# Patient Record
Sex: Female | Born: 1950 | Race: Black or African American | Hispanic: No | Smoking: Never smoker
Health system: Southern US, Community
[De-identification: ages and names within clinical notes are randomized; demographics above are authoritative.]

## PROBLEM LIST (undated history)

## (undated) DIAGNOSIS — M1990 Unspecified osteoarthritis, unspecified site: Secondary | ICD-10-CM

## (undated) DIAGNOSIS — I10 Essential (primary) hypertension: Secondary | ICD-10-CM

## (undated) DIAGNOSIS — E785 Hyperlipidemia, unspecified: Secondary | ICD-10-CM

## (undated) HISTORY — DX: Essential (primary) hypertension: I10

## (undated) HISTORY — DX: Hyperlipidemia, unspecified: E78.5

## (undated) HISTORY — DX: Unspecified osteoarthritis, unspecified site: M19.90

---

## 1998-03-31 ENCOUNTER — Encounter: Admission: RE | Admit: 1998-03-31 | Discharge: 1998-06-29 | Payer: Self-pay | Admitting: Family Medicine

## 1998-03-31 ENCOUNTER — Encounter: Admission: RE | Admit: 1998-03-31 | Discharge: 1998-06-29 | Payer: Self-pay | Admitting: *Deleted

## 1998-04-22 ENCOUNTER — Encounter: Admission: RE | Admit: 1998-04-22 | Discharge: 1998-04-22 | Payer: Self-pay | Admitting: Family Medicine

## 1998-08-30 ENCOUNTER — Encounter: Admission: RE | Admit: 1998-08-30 | Discharge: 1998-08-30 | Payer: Self-pay | Admitting: Sports Medicine

## 1998-08-31 ENCOUNTER — Encounter: Admission: RE | Admit: 1998-08-31 | Discharge: 1998-08-31 | Payer: Self-pay | Admitting: Family Medicine

## 1998-09-27 ENCOUNTER — Encounter: Admission: RE | Admit: 1998-09-27 | Discharge: 1998-09-27 | Payer: Self-pay | Admitting: Family Medicine

## 1998-10-27 ENCOUNTER — Encounter: Admission: RE | Admit: 1998-10-27 | Discharge: 1998-10-27 | Payer: Self-pay | Admitting: Family Medicine

## 1998-11-04 ENCOUNTER — Encounter: Admission: RE | Admit: 1998-11-04 | Discharge: 1998-11-04 | Payer: Self-pay | Admitting: Family Medicine

## 1998-12-28 ENCOUNTER — Encounter: Admission: RE | Admit: 1998-12-28 | Discharge: 1998-12-28 | Payer: Self-pay | Admitting: Family Medicine

## 1999-07-03 ENCOUNTER — Encounter: Admission: RE | Admit: 1999-07-03 | Discharge: 1999-07-03 | Payer: Self-pay | Admitting: Family Medicine

## 1999-07-07 ENCOUNTER — Encounter: Admission: RE | Admit: 1999-07-07 | Discharge: 1999-07-07 | Payer: Self-pay | Admitting: Family Medicine

## 1999-08-23 ENCOUNTER — Encounter: Admission: RE | Admit: 1999-08-23 | Discharge: 1999-08-23 | Payer: Self-pay | Admitting: Family Medicine

## 1999-10-13 ENCOUNTER — Encounter: Admission: RE | Admit: 1999-10-13 | Discharge: 1999-10-13 | Payer: Self-pay | Admitting: Family Medicine

## 1999-11-10 ENCOUNTER — Encounter: Admission: RE | Admit: 1999-11-10 | Discharge: 1999-11-10 | Payer: Self-pay | Admitting: Family Medicine

## 1999-12-29 ENCOUNTER — Encounter: Admission: RE | Admit: 1999-12-29 | Discharge: 1999-12-29 | Payer: Self-pay | Admitting: Family Medicine

## 2000-01-10 ENCOUNTER — Other Ambulatory Visit: Admission: RE | Admit: 2000-01-10 | Discharge: 2000-01-10 | Payer: Self-pay | Admitting: Obstetrics & Gynecology

## 2000-01-10 ENCOUNTER — Encounter: Admission: RE | Admit: 2000-01-10 | Discharge: 2000-01-10 | Payer: Self-pay | Admitting: Family Medicine

## 2000-04-02 ENCOUNTER — Encounter: Admission: RE | Admit: 2000-04-02 | Discharge: 2000-04-02 | Payer: Self-pay | Admitting: Sports Medicine

## 2000-07-04 ENCOUNTER — Encounter: Admission: RE | Admit: 2000-07-04 | Discharge: 2000-07-04 | Payer: Self-pay | Admitting: Family Medicine

## 2000-11-08 ENCOUNTER — Encounter: Admission: RE | Admit: 2000-11-08 | Discharge: 2000-11-08 | Payer: Self-pay | Admitting: Family Medicine

## 2001-03-21 ENCOUNTER — Encounter: Admission: RE | Admit: 2001-03-21 | Discharge: 2001-03-21 | Payer: Self-pay | Admitting: Family Medicine

## 2001-03-24 ENCOUNTER — Emergency Department (HOSPITAL_COMMUNITY): Admission: EM | Admit: 2001-03-24 | Discharge: 2001-03-24 | Payer: Self-pay | Admitting: Emergency Medicine

## 2001-03-24 ENCOUNTER — Encounter: Payer: Self-pay | Admitting: Emergency Medicine

## 2001-04-04 ENCOUNTER — Encounter: Admission: RE | Admit: 2001-04-04 | Discharge: 2001-04-04 | Payer: Self-pay | Admitting: Family Medicine

## 2001-05-01 ENCOUNTER — Encounter: Admission: RE | Admit: 2001-05-01 | Discharge: 2001-05-01 | Payer: Self-pay | Admitting: Family Medicine

## 2001-05-01 ENCOUNTER — Other Ambulatory Visit: Admission: RE | Admit: 2001-05-01 | Discharge: 2001-05-01 | Payer: Self-pay | Admitting: Family Medicine

## 2001-06-24 ENCOUNTER — Encounter: Admission: RE | Admit: 2001-06-24 | Discharge: 2001-06-24 | Payer: Self-pay | Admitting: Family Medicine

## 2001-08-22 ENCOUNTER — Encounter: Admission: RE | Admit: 2001-08-22 | Discharge: 2001-08-22 | Payer: Self-pay | Admitting: Family Medicine

## 2001-10-15 ENCOUNTER — Encounter: Admission: RE | Admit: 2001-10-15 | Discharge: 2001-10-15 | Payer: Self-pay | Admitting: Family Medicine

## 2001-10-28 ENCOUNTER — Encounter: Admission: RE | Admit: 2001-10-28 | Discharge: 2001-10-28 | Payer: Self-pay | Admitting: Family Medicine

## 2001-12-12 ENCOUNTER — Encounter: Admission: RE | Admit: 2001-12-12 | Discharge: 2001-12-12 | Payer: Self-pay | Admitting: Family Medicine

## 2002-04-01 ENCOUNTER — Encounter: Admission: RE | Admit: 2002-04-01 | Discharge: 2002-04-01 | Payer: Self-pay | Admitting: Family Medicine

## 2002-05-07 ENCOUNTER — Other Ambulatory Visit: Admission: RE | Admit: 2002-05-07 | Discharge: 2002-05-07 | Payer: Self-pay | Admitting: Family Medicine

## 2002-05-07 ENCOUNTER — Encounter: Admission: RE | Admit: 2002-05-07 | Discharge: 2002-05-07 | Payer: Self-pay | Admitting: Family Medicine

## 2002-10-13 ENCOUNTER — Encounter: Admission: RE | Admit: 2002-10-13 | Discharge: 2002-10-13 | Payer: Self-pay | Admitting: Family Medicine

## 2002-11-02 ENCOUNTER — Encounter: Admission: RE | Admit: 2002-11-02 | Discharge: 2002-11-02 | Payer: Self-pay | Admitting: Family Medicine

## 2003-04-26 ENCOUNTER — Encounter: Admission: RE | Admit: 2003-04-26 | Discharge: 2003-04-26 | Payer: Self-pay | Admitting: Family Medicine

## 2003-04-28 ENCOUNTER — Encounter: Admission: RE | Admit: 2003-04-28 | Discharge: 2003-04-28 | Payer: Self-pay | Admitting: Sports Medicine

## 2003-04-28 ENCOUNTER — Encounter: Payer: Self-pay | Admitting: Sports Medicine

## 2003-05-28 ENCOUNTER — Other Ambulatory Visit: Admission: RE | Admit: 2003-05-28 | Discharge: 2003-05-28 | Payer: Self-pay | Admitting: Family Medicine

## 2003-05-28 ENCOUNTER — Encounter: Admission: RE | Admit: 2003-05-28 | Discharge: 2003-05-28 | Payer: Self-pay | Admitting: Family Medicine

## 2003-07-01 ENCOUNTER — Encounter: Admission: RE | Admit: 2003-07-01 | Discharge: 2003-07-01 | Payer: Self-pay | Admitting: Sports Medicine

## 2003-10-14 ENCOUNTER — Encounter: Admission: RE | Admit: 2003-10-14 | Discharge: 2003-10-14 | Payer: Self-pay | Admitting: Family Medicine

## 2003-10-27 ENCOUNTER — Encounter: Admission: RE | Admit: 2003-10-27 | Discharge: 2003-10-27 | Payer: Self-pay | Admitting: Sports Medicine

## 2003-10-29 ENCOUNTER — Emergency Department (HOSPITAL_COMMUNITY): Admission: AD | Admit: 2003-10-29 | Discharge: 2003-10-29 | Payer: Self-pay | Admitting: Family Medicine

## 2003-11-03 ENCOUNTER — Emergency Department (HOSPITAL_COMMUNITY): Admission: AD | Admit: 2003-11-03 | Discharge: 2003-11-03 | Payer: Self-pay | Admitting: Family Medicine

## 2003-11-05 ENCOUNTER — Encounter: Admission: RE | Admit: 2003-11-05 | Discharge: 2003-11-05 | Payer: Self-pay | Admitting: Sports Medicine

## 2004-04-10 ENCOUNTER — Encounter: Admission: RE | Admit: 2004-04-10 | Discharge: 2004-04-10 | Payer: Self-pay | Admitting: Family Medicine

## 2004-06-23 ENCOUNTER — Encounter: Admission: RE | Admit: 2004-06-23 | Discharge: 2004-06-23 | Payer: Self-pay | Admitting: Sports Medicine

## 2004-09-30 ENCOUNTER — Encounter (INDEPENDENT_AMBULATORY_CARE_PROVIDER_SITE_OTHER): Payer: Self-pay | Admitting: *Deleted

## 2004-09-30 LAB — CONVERTED CEMR LAB

## 2004-10-04 ENCOUNTER — Ambulatory Visit: Payer: Self-pay | Admitting: Family Medicine

## 2004-10-11 ENCOUNTER — Ambulatory Visit: Payer: Self-pay | Admitting: Family Medicine

## 2004-10-11 ENCOUNTER — Other Ambulatory Visit: Admission: RE | Admit: 2004-10-11 | Discharge: 2004-10-11 | Payer: Self-pay | Admitting: Family Medicine

## 2004-12-04 ENCOUNTER — Ambulatory Visit: Payer: Self-pay | Admitting: Family Medicine

## 2005-02-12 ENCOUNTER — Ambulatory Visit: Payer: Self-pay | Admitting: Sports Medicine

## 2005-06-13 ENCOUNTER — Ambulatory Visit: Payer: Self-pay | Admitting: Family Medicine

## 2005-09-20 ENCOUNTER — Ambulatory Visit: Payer: Self-pay | Admitting: Family Medicine

## 2005-10-08 ENCOUNTER — Ambulatory Visit: Payer: Self-pay | Admitting: Family Medicine

## 2006-03-04 ENCOUNTER — Ambulatory Visit: Payer: Self-pay | Admitting: Family Medicine

## 2006-03-07 ENCOUNTER — Emergency Department (HOSPITAL_COMMUNITY): Admission: EM | Admit: 2006-03-07 | Discharge: 2006-03-07 | Payer: Self-pay | Admitting: Family Medicine

## 2006-10-09 ENCOUNTER — Ambulatory Visit: Payer: Self-pay | Admitting: Sports Medicine

## 2007-02-27 DIAGNOSIS — M1990 Unspecified osteoarthritis, unspecified site: Secondary | ICD-10-CM | POA: Insufficient documentation

## 2007-02-27 DIAGNOSIS — M479 Spondylosis, unspecified: Secondary | ICD-10-CM | POA: Insufficient documentation

## 2007-02-27 DIAGNOSIS — I10 Essential (primary) hypertension: Secondary | ICD-10-CM | POA: Insufficient documentation

## 2007-02-27 DIAGNOSIS — F411 Generalized anxiety disorder: Secondary | ICD-10-CM | POA: Insufficient documentation

## 2007-02-27 DIAGNOSIS — M545 Low back pain: Secondary | ICD-10-CM | POA: Insufficient documentation

## 2007-02-27 DIAGNOSIS — E669 Obesity, unspecified: Secondary | ICD-10-CM | POA: Insufficient documentation

## 2007-02-28 ENCOUNTER — Encounter (INDEPENDENT_AMBULATORY_CARE_PROVIDER_SITE_OTHER): Payer: Self-pay | Admitting: *Deleted

## 2007-08-28 ENCOUNTER — Ambulatory Visit: Payer: Self-pay | Admitting: Family Medicine

## 2007-09-11 ENCOUNTER — Telehealth (INDEPENDENT_AMBULATORY_CARE_PROVIDER_SITE_OTHER): Payer: Self-pay | Admitting: Family Medicine

## 2007-09-13 ENCOUNTER — Telehealth (INDEPENDENT_AMBULATORY_CARE_PROVIDER_SITE_OTHER): Payer: Self-pay | Admitting: Family Medicine

## 2007-09-18 ENCOUNTER — Encounter (INDEPENDENT_AMBULATORY_CARE_PROVIDER_SITE_OTHER): Payer: Self-pay | Admitting: Family Medicine

## 2007-09-29 ENCOUNTER — Ambulatory Visit: Payer: Self-pay | Admitting: Family Medicine

## 2007-09-30 ENCOUNTER — Encounter (INDEPENDENT_AMBULATORY_CARE_PROVIDER_SITE_OTHER): Payer: Self-pay | Admitting: Family Medicine

## 2007-10-01 ENCOUNTER — Telehealth (INDEPENDENT_AMBULATORY_CARE_PROVIDER_SITE_OTHER): Payer: Self-pay | Admitting: Family Medicine

## 2007-10-27 ENCOUNTER — Ambulatory Visit: Payer: Self-pay | Admitting: Sports Medicine

## 2007-10-27 ENCOUNTER — Other Ambulatory Visit: Admission: RE | Admit: 2007-10-27 | Discharge: 2007-10-27 | Payer: Self-pay | Admitting: Family Medicine

## 2007-10-27 ENCOUNTER — Encounter (INDEPENDENT_AMBULATORY_CARE_PROVIDER_SITE_OTHER): Payer: Self-pay | Admitting: Family Medicine

## 2007-10-28 LAB — CONVERTED CEMR LAB
Pap Smear: NORMAL
Pap Smear: NORMAL

## 2007-10-29 ENCOUNTER — Telehealth (INDEPENDENT_AMBULATORY_CARE_PROVIDER_SITE_OTHER): Payer: Self-pay | Admitting: Family Medicine

## 2007-10-29 ENCOUNTER — Encounter (INDEPENDENT_AMBULATORY_CARE_PROVIDER_SITE_OTHER): Payer: Self-pay | Admitting: Family Medicine

## 2007-11-13 ENCOUNTER — Telehealth: Payer: Self-pay | Admitting: Family Medicine

## 2007-12-01 ENCOUNTER — Encounter: Payer: Self-pay | Admitting: *Deleted

## 2008-02-10 ENCOUNTER — Telehealth: Payer: Self-pay | Admitting: Family Medicine

## 2008-04-08 ENCOUNTER — Ambulatory Visit: Payer: Self-pay | Admitting: Family Medicine

## 2008-04-08 LAB — CONVERTED CEMR LAB
Blood in Urine, dipstick: NEGATIVE
Glucose, Urine, Semiquant: NEGATIVE
Nitrite: NEGATIVE
Specific Gravity, Urine: 1.02
WBC Urine, dipstick: NEGATIVE
pH: 6.5

## 2008-05-03 ENCOUNTER — Encounter: Payer: Self-pay | Admitting: *Deleted

## 2008-05-04 ENCOUNTER — Encounter: Payer: Self-pay | Admitting: Family Medicine

## 2008-05-13 ENCOUNTER — Encounter: Admission: RE | Admit: 2008-05-13 | Discharge: 2008-05-13 | Payer: Self-pay | Admitting: Sports Medicine

## 2008-05-18 ENCOUNTER — Encounter: Payer: Self-pay | Admitting: Family Medicine

## 2008-05-25 ENCOUNTER — Telehealth: Payer: Self-pay | Admitting: *Deleted

## 2008-05-27 ENCOUNTER — Telehealth: Payer: Self-pay | Admitting: Family Medicine

## 2008-06-14 ENCOUNTER — Telehealth: Payer: Self-pay | Admitting: *Deleted

## 2008-06-14 ENCOUNTER — Ambulatory Visit: Payer: Self-pay | Admitting: Family Medicine

## 2008-06-15 ENCOUNTER — Encounter: Admission: RE | Admit: 2008-06-15 | Discharge: 2008-07-05 | Payer: Self-pay | Admitting: Sports Medicine

## 2008-06-22 ENCOUNTER — Telehealth: Payer: Self-pay | Admitting: *Deleted

## 2008-06-29 ENCOUNTER — Encounter: Payer: Self-pay | Admitting: Family Medicine

## 2008-07-02 ENCOUNTER — Encounter: Payer: Self-pay | Admitting: Family Medicine

## 2008-07-12 ENCOUNTER — Encounter: Payer: Self-pay | Admitting: Family Medicine

## 2008-07-12 ENCOUNTER — Ambulatory Visit: Payer: Self-pay | Admitting: Sports Medicine

## 2008-07-12 LAB — CONVERTED CEMR LAB
ALT: 16 units/L (ref 0–35)
AST: 19 units/L (ref 0–37)
CO2: 26 meq/L (ref 19–32)
Chloride: 102 meq/L (ref 96–112)
Creatinine, Ser: 1.01 mg/dL (ref 0.40–1.20)
LDL Cholesterol: 123 mg/dL — ABNORMAL HIGH (ref 0–99)
Sodium: 139 meq/L (ref 135–145)
Total Bilirubin: 0.3 mg/dL (ref 0.3–1.2)
Total CHOL/HDL Ratio: 4.9
Total Protein: 7.3 g/dL (ref 6.0–8.3)
VLDL: 25 mg/dL (ref 0–40)

## 2008-07-19 ENCOUNTER — Encounter: Payer: Self-pay | Admitting: *Deleted

## 2008-07-19 ENCOUNTER — Telehealth: Payer: Self-pay | Admitting: *Deleted

## 2008-08-05 ENCOUNTER — Telehealth: Payer: Self-pay | Admitting: *Deleted

## 2008-08-18 ENCOUNTER — Ambulatory Visit: Payer: Self-pay | Admitting: Family Medicine

## 2008-09-03 ENCOUNTER — Ambulatory Visit: Payer: Self-pay | Admitting: Family Medicine

## 2008-09-08 ENCOUNTER — Encounter: Payer: Self-pay | Admitting: Family Medicine

## 2008-09-13 ENCOUNTER — Telehealth: Payer: Self-pay | Admitting: *Deleted

## 2008-09-15 ENCOUNTER — Encounter: Payer: Self-pay | Admitting: *Deleted

## 2008-09-20 ENCOUNTER — Ambulatory Visit: Payer: Self-pay | Admitting: Family Medicine

## 2008-09-29 ENCOUNTER — Telehealth: Payer: Self-pay | Admitting: Family Medicine

## 2008-09-30 ENCOUNTER — Telehealth: Payer: Self-pay | Admitting: Family Medicine

## 2008-10-14 ENCOUNTER — Ambulatory Visit: Payer: Self-pay

## 2008-10-14 ENCOUNTER — Encounter (INDEPENDENT_AMBULATORY_CARE_PROVIDER_SITE_OTHER): Payer: Self-pay | Admitting: *Deleted

## 2008-10-23 ENCOUNTER — Telehealth (INDEPENDENT_AMBULATORY_CARE_PROVIDER_SITE_OTHER): Payer: Self-pay | Admitting: Family Medicine

## 2008-10-25 ENCOUNTER — Telehealth: Payer: Self-pay | Admitting: *Deleted

## 2008-10-25 ENCOUNTER — Ambulatory Visit: Payer: Self-pay | Admitting: Family Medicine

## 2008-10-29 ENCOUNTER — Telehealth: Payer: Self-pay | Admitting: *Deleted

## 2008-12-03 ENCOUNTER — Ambulatory Visit: Payer: Self-pay | Admitting: Family Medicine

## 2008-12-06 ENCOUNTER — Telehealth: Payer: Self-pay | Admitting: *Deleted

## 2009-06-01 ENCOUNTER — Encounter: Payer: Self-pay | Admitting: Family Medicine

## 2009-06-01 DIAGNOSIS — M4800 Spinal stenosis, site unspecified: Secondary | ICD-10-CM

## 2009-06-15 ENCOUNTER — Ambulatory Visit: Payer: Self-pay | Admitting: Family Medicine

## 2009-08-31 ENCOUNTER — Ambulatory Visit: Payer: Self-pay | Admitting: Family Medicine

## 2009-08-31 ENCOUNTER — Telehealth: Payer: Self-pay | Admitting: Family Medicine

## 2009-09-01 ENCOUNTER — Ambulatory Visit (HOSPITAL_COMMUNITY): Admission: RE | Admit: 2009-09-01 | Discharge: 2009-09-01 | Payer: Self-pay | Admitting: Family Medicine

## 2009-09-01 ENCOUNTER — Telehealth: Payer: Self-pay | Admitting: Family Medicine

## 2009-09-09 ENCOUNTER — Ambulatory Visit: Payer: Self-pay | Admitting: Family Medicine

## 2009-11-01 ENCOUNTER — Telehealth: Payer: Self-pay | Admitting: *Deleted

## 2009-11-09 ENCOUNTER — Ambulatory Visit: Payer: Self-pay | Admitting: Family Medicine

## 2010-03-21 ENCOUNTER — Telehealth: Payer: Self-pay | Admitting: Family Medicine

## 2010-03-22 ENCOUNTER — Ambulatory Visit: Payer: Self-pay | Admitting: Family Medicine

## 2010-03-22 ENCOUNTER — Encounter: Payer: Self-pay | Admitting: Family Medicine

## 2010-03-23 LAB — CONVERTED CEMR LAB
Albumin: 4 g/dL (ref 3.5–5.2)
Alkaline Phosphatase: 65 units/L (ref 39–117)
BUN: 15 mg/dL (ref 6–23)
CO2: 27 meq/L (ref 19–32)
Calcium: 9.6 mg/dL (ref 8.4–10.5)
Chloride: 101 meq/L (ref 96–112)
Direct LDL: 139 mg/dL — ABNORMAL HIGH
Glucose, Bld: 100 mg/dL — ABNORMAL HIGH (ref 70–99)
Hemoglobin: 10.9 g/dL — ABNORMAL LOW (ref 12.0–15.0)
Potassium: 4 meq/L (ref 3.5–5.3)
RBC: 3.53 M/uL — ABNORMAL LOW (ref 3.87–5.11)
Sodium: 138 meq/L (ref 135–145)
Total Protein: 7.8 g/dL (ref 6.0–8.3)
WBC: 7.9 10*3/uL (ref 4.0–10.5)

## 2010-03-31 ENCOUNTER — Encounter: Payer: Self-pay | Admitting: Family Medicine

## 2010-03-31 ENCOUNTER — Ambulatory Visit: Payer: Self-pay | Admitting: Family Medicine

## 2010-03-31 DIAGNOSIS — E785 Hyperlipidemia, unspecified: Secondary | ICD-10-CM

## 2010-03-31 DIAGNOSIS — D649 Anemia, unspecified: Secondary | ICD-10-CM

## 2010-03-31 DIAGNOSIS — E739 Lactose intolerance, unspecified: Secondary | ICD-10-CM | POA: Insufficient documentation

## 2010-04-05 LAB — CONVERTED CEMR LAB
Iron: 42 ug/dL (ref 42–145)
Saturation Ratios: 14 % — ABNORMAL LOW (ref 20–55)
TIBC: 290 ug/dL (ref 250–470)
UIBC: 248 ug/dL
Vitamin B-12: 633 pg/mL (ref 211–911)

## 2010-05-01 ENCOUNTER — Telehealth: Payer: Self-pay | Admitting: Family Medicine

## 2010-06-13 ENCOUNTER — Encounter: Payer: Self-pay | Admitting: Family Medicine

## 2010-08-01 ENCOUNTER — Emergency Department (HOSPITAL_COMMUNITY): Admission: EM | Admit: 2010-08-01 | Discharge: 2010-08-01 | Payer: Self-pay | Admitting: Emergency Medicine

## 2010-08-01 ENCOUNTER — Telehealth: Payer: Self-pay | Admitting: Family Medicine

## 2010-08-01 ENCOUNTER — Emergency Department (HOSPITAL_COMMUNITY): Admission: EM | Admit: 2010-08-01 | Discharge: 2010-08-01 | Payer: Self-pay | Admitting: Family Medicine

## 2010-08-02 ENCOUNTER — Telehealth (INDEPENDENT_AMBULATORY_CARE_PROVIDER_SITE_OTHER): Payer: Self-pay | Admitting: *Deleted

## 2010-08-02 ENCOUNTER — Telehealth: Payer: Self-pay | Admitting: Family Medicine

## 2010-08-03 ENCOUNTER — Telehealth: Payer: Self-pay | Admitting: Family Medicine

## 2010-08-03 ENCOUNTER — Telehealth (INDEPENDENT_AMBULATORY_CARE_PROVIDER_SITE_OTHER): Payer: Self-pay | Admitting: *Deleted

## 2010-08-04 ENCOUNTER — Encounter: Payer: Self-pay | Admitting: Family Medicine

## 2010-08-04 ENCOUNTER — Ambulatory Visit: Payer: Self-pay | Admitting: Family Medicine

## 2010-08-09 ENCOUNTER — Telehealth: Payer: Self-pay | Admitting: Family Medicine

## 2010-08-10 ENCOUNTER — Encounter: Payer: Self-pay | Admitting: Family Medicine

## 2010-08-10 ENCOUNTER — Ambulatory Visit (HOSPITAL_COMMUNITY): Admission: RE | Admit: 2010-08-10 | Discharge: 2010-08-10 | Payer: Self-pay | Admitting: Family Medicine

## 2010-08-10 ENCOUNTER — Ambulatory Visit: Payer: Self-pay | Admitting: Surgery

## 2010-08-11 LAB — CONVERTED CEMR LAB
BUN: 15 mg/dL (ref 6–23)
CO2: 27 meq/L (ref 19–32)
Calcium: 9 mg/dL (ref 8.4–10.5)
Creatinine, Ser: 0.98 mg/dL (ref 0.40–1.20)
Direct LDL: 136 mg/dL — ABNORMAL HIGH
Glucose, Bld: 109 mg/dL — ABNORMAL HIGH (ref 70–99)
TSH: 1.719 microintl units/mL (ref 0.350–4.500)
Total Bilirubin: 0.4 mg/dL (ref 0.3–1.2)

## 2010-08-30 ENCOUNTER — Ambulatory Visit: Payer: Self-pay | Admitting: Family Medicine

## 2010-08-30 LAB — CONVERTED CEMR LAB
HDL goal, serum: 40 mg/dL
LDL Goal: 130 mg/dL

## 2010-09-25 ENCOUNTER — Telehealth: Payer: Self-pay | Admitting: *Deleted

## 2010-09-26 ENCOUNTER — Ambulatory Visit: Payer: Self-pay | Admitting: Family Medicine

## 2010-11-04 ENCOUNTER — Telehealth: Payer: Self-pay | Admitting: Family Medicine

## 2010-11-22 ENCOUNTER — Telehealth: Payer: Self-pay | Admitting: *Deleted

## 2010-11-27 ENCOUNTER — Telehealth (INDEPENDENT_AMBULATORY_CARE_PROVIDER_SITE_OTHER): Payer: Self-pay | Admitting: *Deleted

## 2010-11-28 ENCOUNTER — Ambulatory Visit: Payer: Self-pay | Admitting: Family Medicine

## 2010-11-28 ENCOUNTER — Encounter: Payer: Self-pay | Admitting: Family Medicine

## 2010-11-28 LAB — CONVERTED CEMR LAB
BUN: 16 mg/dL (ref 6–23)
CO2: 29 meq/L (ref 19–32)
Calcium: 8.7 mg/dL (ref 8.4–10.5)
Chloride: 103 meq/L (ref 96–112)
Creatinine, Ser: 0.96 mg/dL (ref 0.40–1.20)
Glucose, Bld: 106 mg/dL — ABNORMAL HIGH (ref 70–99)
Potassium: 3.4 meq/L — ABNORMAL LOW (ref 3.5–5.3)
Sodium: 141 meq/L (ref 135–145)
TSH: 0.633 microintl units/mL (ref 0.350–4.500)

## 2010-11-29 ENCOUNTER — Encounter: Payer: Self-pay | Admitting: Family Medicine

## 2010-12-09 ENCOUNTER — Telehealth: Payer: Self-pay | Admitting: Family Medicine

## 2010-12-12 ENCOUNTER — Ambulatory Visit: Payer: Self-pay

## 2010-12-26 ENCOUNTER — Encounter: Payer: Self-pay | Admitting: Family Medicine

## 2011-01-16 ENCOUNTER — Ambulatory Visit: Admit: 2011-01-16 | Payer: Self-pay

## 2011-01-30 NOTE — Progress Notes (Signed)
Summary: Lisinopril and Ibuprofen 800 refilled  Pt called wanting to know if we could refill her meds. The lisinopril was filled on the 4th but her pharmacy dosn't have it she says. She also needs her pain meds refilled. I will refill for 1 month and pt is aware that she needs to make an appointment to be seen. Jamie Brookes MD  November 04, 2010 5:35 PM       New/Updated Medications: IBUPROFEN 800 MG TABS (IBUPROFEN) 1 tab by mouth three times a day as needed pain. Prescriptions: IBUPROFEN 800 MG TABS (IBUPROFEN) 1 tab by mouth three times a day as needed pain.  #90 x 0   Entered and Authorized by:   Jamie Brookes MD   Signed by:   Jamie Brookes MD on 11/04/2010   Method used:   Electronically to        [facility]. [address] (retail)       [address]       [place], [place]  [address]       Ph: [phone]       Fax: [phone]   RxID:   [id number] LISINOPRIL 20 MG TABS (LISINOPRIL) Take 1 tablet by mouth once a day  #90 x 0   Entered and Authorized by:   Jamie Brookes MD   Signed by:   Jamie Brookes MD on 11/04/2010   Method used:   Electronically to        [facility]. [address] (retail)       [address]       [place], [place]  [address]       Ph: [phone]       Fax: [phone]   RxID:   [id number]

## 2011-01-30 NOTE — Progress Notes (Signed)
Summary: results  Phone Note Call from Patient Call back at Home Phone [phone]   Caller: Patient Summary of Call: wants test results Initial call taken by: De Nurse,  August 09, 2010 4:30 PM  Follow-up for Phone Call        told her glucose & LDL are elevated. discussed diet. she does eat a lot of starchy foods. advised smaller portions and no sweets. exercise will help bring it down as well.  she told me she was going [facility] tomorrow to see why she was numb at times in her face. wished her luck with test Follow-up by: Golden Circle RN,  August 09, 2010 4:39 PM
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
## 2011-01-30 NOTE — Assessment & Plan Note (Signed)
Summary: [place] for cough/kf   Vital Signs:  Patient profile:   60 year old female Weight:      261 pounds Temp:     98.6 degrees F oral Pulse rate:   91 / minute Pulse rhythm:   regular BP sitting:   140 / 82  (left arm) Cuff size:   large  Vitals Entered By: Loralee Pacas CMA (September 26, 2010 9:02 AM) CC: cough   Primary Care Provider:  Ardyth Gal MD  CC:  cough.  History of Present Illness: 60 yo seen for work in appt  several days of nonproductive cough, no fever, rhinorrhea,.Says she usually gets a "cold" every fall. No itchy eyes or nose.  Feeling tired, cough keeping her up. nonsmoker.  Habits & Providers  Alcohol-Tobacco-Diet     Tobacco Status: never  Current Medications (verified): 1)  Lisinopril 20 Mg Tabs (Lisinopril) .... Take 1 Tablet By Mouth Once A Day 2)  Ibuprofen 800 Mg Tabs (Ibuprofen) .Marland Kitchen.. 1 Tab By Mouth Three Times A Day As Needed Pain. 3)  International aid/development worker (Misc. Devices) .... Use As Directed. 4)  Hydrochlorothiazide 25 Mg  Tabs (Hydrochlorothiazide) .... Take 1 Tab By Mouth Every Morning 5)  Tramadol Hcl 50 Mg Tabs (Tramadol Hcl) .... Take 3 Times Daily For Pain With Extra Strength Tylenol 6)  Aspir-Low 81 Mg Tbec (Aspirin) 7)  Tussionex Pennkinetic Er 10-8 Mg/11ml Lqcr (Hydrocod Polst-Chlorphen Polst) .... Take 5 Ml Every 12 Hours As Needed For Cough.  Dispense 50 Ml.  Allergies: 1)  ! * Benazepril PMH-FH-SH reviewed for relevance  Review of Systems      See HPI  Physical Exam  General:  Well-developed,well-nourished,in no acute distress; alert,appropriate and cooperative throughout examination Ears:  External ear exam shows no significant lesions or deformities.  Otoscopic examination reveals clear canals, tympanic membranes are intact bilaterally without bulging, retraction, inflammation or discharge. Hearing is grossly normal bilaterally. Nose:  External nasal examination shows no deformity or inflammation. Nasal mucosa are pink and  moist without lesions or exudates. Mouth:  Oral mucosa and oropharynx without lesions or exudates.  Teeth in good repair. Lungs:  Normal respiratory effort, chest expands symmetrically. Lungs are clear to auscultation, no crackles or wheezes. Heart:  Normal rate and regular rhythm. S1 and S2 normal without gallop, murmur, click, rub or other extra sounds.   Impression & Recommendations:  Problem # 1:  URI (ICD-465.9)  possibly allergy related.  discussed trial of antihistamines. Patient asks for tussionex.  I did not see any red flags in chart.  Will prescribe limited course.  It cough persists, consider ACE-I cough or GERD.  Her updated medication list for this problem includes:    Ibuprofen 800 Mg Tabs (Ibuprofen) .Marland Kitchen... 1 tab by mouth three times a day as needed pain.    Aspir-low 81 Mg Tbec (Aspirin)    Tussionex Pennkinetic Er 10-8 Mg/102ml Lqcr (Hydrocod polst-chlorphen polst) .Marland Kitchen... Take 5 ml every 12 hours as needed for cough.  dispense 50 ml.  Orders: [facility]- Est Level  3 ([address])  Complete Medication List: 1)  Lisinopril 20 Mg Tabs (Lisinopril) .... Take 1 tablet by mouth once a day 2)  Ibuprofen 800 Mg Tabs (Ibuprofen) .Marland Kitchen.. 1 tab by mouth three times a day as needed pain. 3)  Probation officer (Misc. devices) .... Use as directed. 4)  Hydrochlorothiazide 25 Mg Tabs (Hydrochlorothiazide) .... Take 1 tab by mouth every morning 5)  Tramadol Hcl 50 Mg Tabs (Tramadol hcl) .Marland KitchenMarland KitchenMarland Kitchen  Take 3 times daily for pain with extra strength tylenol 6)  Aspir-low 81 Mg Tbec (Aspirin) 7)  Tussionex Pennkinetic Er 10-8 Mg/49ml Lqcr (Hydrocod polst-chlorphen polst) .... Take 5 ml every 12 hours as needed for cough.  dispense 50 ml.  Patient Instructions: 1)  see information on cough and cold. 2)  Caution, cough syrup may make you drowsy. Prescriptions: TUSSIONEX PENNKINETIC ER 10-8 MG/5ML LQCR (HYDROCOD POLST-CHLORPHEN POLST) take 5 ml every 12 hours as needed for cough.  Dispense 50 ml.  #1 x 0   Entered and  Authorized by:   Delbert Harness MD   Signed by:   Delbert Harness MD on 09/26/2010   Method used:   Print then Give to Patient   RxID:   [id number]

## 2011-01-30 NOTE — Assessment & Plan Note (Signed)
Summary: numb elbow & 2 fingers l hand/Sebastian/Leonardo Makris   Vital Signs:  Patient profile:   60 year old female Height:      68.5 inches Weight:      258 pounds BMI:     38.80 BSA:     2.29 Temp:     98.4 degrees F Pulse rate:   89 / minute BP sitting:   154 / 83  Vitals Entered By: Jone Baseman CMA (March 22, 2010 9:03 AM)  Serial Vital Signs/Assessments:  Comments: 10:19 AM Manual BP: 150/92 By: Garen Grams LPN   CC: left elbow pain with left hand numbness x 4 days Is Patient Diabetic? No Pain Assessment Patient in pain? no        Primary Care Provider:  Ancil Boozer  MD  CC:  left elbow pain with left hand numbness x 4 days.  History of Present Illness: elbow pain and numbness: started that she remembers about 4 days ago.  has noticed for a long time though that when she wakes in the AM her arm is often numb.  sleeps with arm bent tightly at night.  also reports doing repetitive movements during day.  denies any speech changes, weakness of arms or legs.  has been checking BPs at home and per her report they are 120s/80s there.    Habits & Providers  Alcohol-Tobacco-Diet     Tobacco Status: never  Current Medications (verified): 1)  Lisinopril 20 Mg Tabs (Lisinopril) .... Take 1 Tablet By Mouth Once A Day 2)  Ibuprofen 800 Mg Tabs (Ibuprofen) .Marland Kitchen.. 1 Tab By Mouth Three Times A Day As Needed Pain. 3)  International aid/development worker (Misc. Devices) .... Use As Directed. 4)  Hydrochlorothiazide 25 Mg  Tabs (Hydrochlorothiazide) .... Take 1 Tab By Mouth Every Morning 5)  Tramadol Hcl 50 Mg Tabs (Tramadol Hcl) .... Take 3 Times Daily For Pain With Extra Strength Tylenol  Allergies (verified): 1)  ! * Benazepril  Past History:  Past medical, surgical, family and social histories (including risk factors) reviewed for relevance to current acute and chronic problems.  Review of Systems       per HPI.  denies big weight changes, chest pains, shortness of breath, nausea  Physical  Exam  General:  alert, appropriate dress, cooperative to examination, and overweight-appearing.   VS noted - hypertensive Lungs:  normal respiratory effort.   Heart:  normal rate and regular rhythm.   Msk:  tenderness at lateral epicondyle of L arm.  when pressing on that area or along ulnar groove gets same sensation she gets of numbness and pain.  no weakness of upper extremities bilaterally in any muscle group.  sensation intact.  no swelling of extremity.   Neurologic:  alert & oriented X3, cranial nerves II-XII intact, and gait normal.     Impression & Recommendations:  Problem # 1:  NUMBNESS, ARM (ICD-782.0) Assessment New suspect perhaps due to positioning causing compression like symptoms at the elbow of the ulnar nerve.  could also be lateral epicontylitis.  could be both.  will rx both and if not improving have her return. could consider nerve conduction tests/EMG at that time.  discussed also since she was concerned about stroke risk modification - have her keep reccord of BP, check DLDL (not fasting at this time), Cmet, CBC.  could consider checking in future a B12 as well.   Orders: [facility]- Est Level  3 (44010)  Problem # 2:  HYPERTENSION, BENIGN SYSTEMIC (ICD-401.1)  see #1.  if not improved by next visit will likely add additional medication and suggest again medical nutrition therapy.   Her updated medication list for this problem includes:    Lisinopril 20 Mg Tabs (Lisinopril) .Marland Kitchen... Take 1 tablet by mouth once a day    Hydrochlorothiazide 25 Mg Tabs (Hydrochlorothiazide) .Marland Kitchen... Take 1 tab by mouth every morning  Orders: CBC-[facility] (81191) Comp Met-[facility] (47829-56213) Direct LDL-[facility] (08657-84696) [facility]- Est Level  3 (29528)  Complete Medication List: 1)  Lisinopril 20 Mg Tabs (Lisinopril) .... Take 1 tablet by mouth once a day 2)  Ibuprofen 800 Mg Tabs (Ibuprofen) .Marland Kitchen.. 1 tab by mouth three times a day as needed pain. 3)  Probation officer (Misc. devices) .... Use as directed. 4)   Hydrochlorothiazide 25 Mg Tabs (Hydrochlorothiazide) .... Take 1 tab by mouth every morning 5)  Tramadol Hcl 50 Mg Tabs (Tramadol hcl) .... Take 3 times daily for pain with extra strength tylenol  Patient Instructions: 1)  I think the arm numbness is nothing dangerous but is related to how you sleep and to repetitive movements.  2)  To help this - use a handtowel wrapped loosly around your elbow to keep it straight at night. Also use a tennis elbow brace - can be gotten at any pharmacy.  If after 1 month of doing this this is still happening come for a repeat visit.  3)  We'll check some blood work - [employer] [facility] or send you a letter with results.   Prevention & Chronic Care Immunizations   Influenza vaccine: Historical  (07/31/2009)   Influenza vaccine due: Not Indicated    Tetanus booster: 04/30/2002: Done.   Tetanus booster due: 04/30/2012    Pneumococcal vaccine: Not documented  Colorectal Screening   Hemoccult: Done.  (05/01/2003)   Hemoccult due: 04/30/2004    Colonoscopy: Not documented  Other Screening   Pap smear: normal  (10/28/2007)   Pap smear due: 10/27/2010    Mammogram: Done.  (01/31/2005)   Mammogram due: 01/31/2006   Smoking status: never  (03/22/2010)  Lipids   Total Cholesterol: 186  (07/12/2008)   LDL: 123  (07/12/2008)   LDL Direct: Not documented   HDL: 38  (07/12/2008)   Triglycerides: 127  (07/12/2008)  Hypertension   Last Blood Pressure: 154 / 83  (03/22/2010)   Serum creatinine: 1.01  (07/12/2008)   Serum potassium 4.3  (07/12/2008) CMP ordered     Hypertension flowsheet reviewed?: Yes   Progress toward BP goal: Unchanged   Hypertension comments: states her pressures are lower at home.    Self-Management Support :   Personal Goals (by the next clinic visit) :      Personal blood pressure goal: 140/90  (09/09/2009)   Hypertension self-management support: BP self-monitoring log, Written self-care plan, Education handout  (03/22/2010)    Hypertension self-care plan printed.   Hypertension education handout printed

## 2011-01-30 NOTE — Progress Notes (Signed)
Summary: Triage  Phone Note Call from Patient Call back at [phone]   Reason for Call: Talk to Nurse Summary of Call: eye is "jumping", spoke with RN about this last week, pt wants to be sure she didn't have a mild stroke Initial call taken by: Knox Royalty,  November 27, 2010 2:42 PM  Follow-up for Phone Call        Spoke with pt- she states she has continued with intermittent eye twitching since she called last week. She has increased her potassium intake by eating bananas.  Denies problems with speach, or weakness.  States she did feel like her mouth was drooping at one time, but when she looked again felt that it was fine,  she thought this could have been related to missing teeth on this side of her mouth.  Pt states the eye twitching symptoms get better when she has sunglasses on.  Discussed with Dr. Mauricio Po- he states ok for pt to come in for [place] appt tomorrow as the symtpoms have continued for a week.  Advised she should go to ER if she develops weakness, trouble with speech- advised pt of the above from Dr. Mauricio Po- she is agreeable.  Scheduled her for [facility] appt tomorrow am.  Follow-up by: Rochele Pages RN,  November 27, 2010 3:27 PM

## 2011-01-30 NOTE — Progress Notes (Signed)
       Additional Follow-up for Phone Call Additional follow up Details #2::    states her face is aysmetrical since yesterday. her lip is "hanging" on R side. states she has had a lot of stress lately. taking bp meds as ordered. sent to ED now.  she has a ride. Follow-up by: Golden Circle RN,  August 01, 2010 11:34 AM

## 2011-01-30 NOTE — Progress Notes (Signed)
Summary: pt is in ER  Phone Note Call from Patient Call back at Home Phone [phone]   Caller: Patient Summary of Call: Pt wanted to let you know that she has gone to [place] because she thought she was having a stroke.  Had blurred vision this morning, right side of face was drooping.  She is waiting for a room to come open in the ER.  She wanted you to know because she had been your pt at the family practice clinic and she will be coming to see you later this month. Initial call taken by: Lowella Petties CMA,  August 01, 2010 2:52 PM

## 2011-01-30 NOTE — Progress Notes (Signed)
Summary: Called pt left message-need to cancel -Medicaid   Phone Note Outgoing Call   Summary of Call: Called pt regarding appt scheduled for August 29,2010-Pt has Medicaid as her ins. We are not accepting Medicaid at any [facility].Daine Gip  August 02, 2010 4:37 PM Per Dr. Dayton Martes see phone note.Daine Gip  August 03, 2010 11:09 AM   Initial call taken by: Daine Gip,  August 02, 2010 4:37 PM  Follow-up for Phone Call        Sp w/ Dr. Dayton Martes, says appt was

## 2011-01-30 NOTE — Progress Notes (Signed)
Summary: Appt cancelled per Dr. Dayton Martes-    Called pt left [facility]-[phone], left message on Physicians line requesting call back.Apolinar Junes 08-03-2009 11:14am.  - --- Converted from flag ---- ---- 08/03/2010 10:27 AM, Ruthe Mannan MD wrote: Yes  we need to address this with [facility].  ---- 08/03/2010 10:07 AM, Daine Gip wrote: Dr. Dayton Martes,  I am sure Lowella Bandy spoke w/ you about this pt. Do you think we sure call Family Practice regarding the Rx's being filled under [facility]. They were the ones that told the pt she had been xfered to [facility] office, and I guess it was because of the Rx filled by [place], Per Lowella Bandy, she called the pharmacy and they told her  the pt did pick up the Rx's under your name. . She only called because she was told to do so. Please advise.  Aram Beecham   ---- 08/03/2010 10:00 AM, Daine Gip wrote: ok, I have informed the staff of your reqeust..Marland KitchenAram Beecham     ---- 08/02/2010 11:54 AM, Ruthe Mannan MD wrote: If future if [facility] patients would like to establish with me, please ask me first.  This pt is very complicated and appears she wanted to see me because her current physican would not fill out her paper work.  This is not an appropriate transfer. Thank you, Talia ------------------------------

## 2011-01-30 NOTE — Letter (Signed)
Summary: Generic Letter  [facility]  [address]   [place], [place] [address]   Phone: [phone]  Fax: [phone]    11/29/2010  Judith [name] [address] [place], [place]  [address]  Dear [name],  I am happy to inform you that your recent labs were normal with the exception of a slightly low potassium. Make sure to include potassium-rich foods in your diet such as beans, potatoes, and bananas.  Sincerely,   Helane Rima DO   Appended Document: Generic Letter mailed

## 2011-01-30 NOTE — Assessment & Plan Note (Signed)
Summary: f/up,tcb   Vital Signs:  Patient profile:   60 year old female Height:      68.5 inches Weight:      254.8 pounds BMI:     38.32 Temp:     97.8 degrees F oral Pulse rate:   82 / minute BP sitting:   143 / 83  (left arm) Cuff size:   large  Vitals Entered By: Garen Grams LPN (April 01, 3243 9:12 AM)  Serial Vital Signs/Assessments:  Time      Position  BP       Pulse  Resp  Temp     By                     132/80                         Ancil Boozer  MD  CC: f/u bloodwork, HTN, handnumbness, L side swelling Is Patient Diabetic? No   Primary Care Provider:  Ancil Boozer  MD  CC:  f/u bloodwork, HTN, handnumbness, and L side swelling.  History of Present Illness: bloodwork/hand numbness: done 2/2 hand numbness.  hand numbness is slowly improving with use of towel to keep elbow straight at night.  still tingles occasionally. discussed that her bloodwork did show some borderline hyperglycemia and hyperlipidemia as well as anemia (that is as of yet undetermined significance).  she states she is not surprised particuarly with anemia as she has been feeling lightheaded at times.  she denies however any blood in her stool and no longer has menstrual cycles. is taking a mvi with iron daily. discussed importance of screening being caught up to help determine cause for anemia that was found.    HTN: other than occasional dizziness denies side effects.  no cough (Except in fall with allergies), no leg swelling, no chest pains.    L side swelling: has noticed particuarly when constipated now for years.  not painful.  moving bowels well.  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   Habits & Providers  Alcohol-Tobacco-Diet     Tobacco Status: never  Current Medications (verified): 1)  Lisinopril 20 Mg Tabs (Lisinopril) .... Take 1 Tablet By Mouth Once A Day 2)  Ibuprofen 800 Mg Tabs (Ibuprofen) .Marland Kitchen.. 1 Tab By Mouth Three Times A Day As Needed  Pain. 3)  International aid/development worker (Misc. Devices) .... Use As Directed. 4)  Hydrochlorothiazide 25 Mg  Tabs (Hydrochlorothiazide) .... Take 1 Tab By Mouth Every Morning 5)  Tramadol Hcl 50 Mg Tabs (Tramadol Hcl) .... Take 3 Times Daily For Pain With Extra Strength Tylenol  Allergies (verified): 1)  ! * Benazepril  Family History: Father deceased from prostate cancer, Mother with hypertension,  4 healthy sisters Denies hx of DM Cousin : hx of thyroid dz Daughter - s/p kidney transplant, diabetes, HTN  Social History: Separated from husband for many years. No divorce 2/2 financial reasons. Sometimes assists husband with medical issues even though they are separated.; Lives with daughter Dawnell Bryant and sons in [place].  No tobacco or alcohol. Unemployed secondary to the demands of her dtr's care. previously worked for [employer]. Walking for exercise  Review of Systems       per HPI.    Physical Exam  General:  alert, appropriate dress, cooperative to examination, and overweight-appearing.   VS noted  Neck:  negative spurlings Lungs:  normal respiratory effort.   Heart:  normal rate and regular rhythm.   Abdomen:  obese.  ? defect in abdominal wall LLQ - nontender.  habitus impairs examination Msk:  no weakness of upper extremities bilaterally in any muscle group.  sensation intact.  no swelling of extremities   Impression & Recommendations:  Problem # 1:  NUMBNESS, ARM (ICD-782.0) Assessment Improved  continue towel, continue lifestyle modifications for other chronic issues to prevent risk for stroke (which is what she is worried about - have reassured her that i do not think this is the cause for her arm numbness)  Orders: [facility]- Est  Level 4 ([address])  Problem # 2:  HYPERLIPIDEMIA, BORDERLINE (ICD-272.4) Assessment: Unchanged  lifestyle modification for now.  repeat FLP in 3 months with CMet  Orders: [facility]- Est  Level 4 ([address])  Problem # 3:  ANEMIA (ICD-285.9) Assessment:  Unchanged labs to help determine cause if possible.  continue MVI with iron for now.  referred for colonoscopy.   Orders: B12-[facility] [phone]) Folate-[facility] [id number]) Ferritin-[facility] [phone]) Iron -[facility] [phone]) Iron Binding Cap (TIBC)-[facility] ([phone]) [facility]- Est  Level 4 ([address])  Problem # 4:  IMPAIRED GLUCOSE TOLERANCE (ICD-271.3) Assessment: Unchanged planned lifestyle modification for now.   Orders: A1C-[facility] ([address]) [facility]- Est  Level 4 ([address]95)  Problem # 5:  HYPERTENSION, BENIGN SYSTEMIC (ICD-401.1) Assessment: Improved  at goal. continue to monitor closely as she is borderline on her control   Her updated medication list for this problem includes:    Lisinopril 20 Mg Tabs (Lisinopril) .Marland Kitchen... Take 1 tablet by mouth once a day    Hydrochlorothiazide 25 Mg Tabs (Hydrochlorothiazide) .Marland Kitchen... Take 1 tab by mouth every morning  Orders: [facility]- Est  Level 4 ([address])  BP today: 143/83 Prior BP: 154/83 (03/22/2010)  Prior 10 Yr Risk Heart Disease: Not enough information (07/12/2008)  Labs Reviewed: K+: 4.0 (03/22/2010) Creat: : 0.99 (03/22/2010)   Chol: 186 (07/12/2008)   HDL: 38 (07/12/2008)   LDL: 123 (07/12/2008)   TG: 127 (07/12/2008)  Problem # 6:  Preventive Health Care (ICD-V70.0) Assessment: Comment Only given handout to schedule her mammogram today  Complete Medication List: 1)  Lisinopril 20 Mg Tabs (Lisinopril) .... Take 1 tablet by mouth once a day 2)  Ibuprofen 800 Mg Tabs (Ibuprofen) .Marland Kitchen.. 1 tab by mouth three times a day as needed pain. 3)  Probation officer (Misc. devices) .... Use as directed. 4)  Hydrochlorothiazide 25 Mg Tabs (Hydrochlorothiazide) .... Take 1 tab by mouth every morning 5)  Tramadol Hcl 50 Mg Tabs (Tramadol hcl) .... Take 3 times daily for pain with extra strength tylenol  Patient Instructions: 1)  Please follow up in about 3 months (and come fasting) so we can repeat some blood work and check on your blood pressure. 2)  Be sure to schedule the  colonosocpy and mammogram.  3)  Exercise regularly to help your blood pressure and blood sugar and cholesterol stay good.   Prevention & Chronic Care Immunizations   Influenza vaccine: Historical  (07/31/2009)   Influenza vaccine due: 08/31/2010    Tetanus booster: 04/30/2002: Done.   Tetanus booster due: 04/30/2012    Pneumococcal vaccine: Not documented  Colorectal Screening   Hemoccult: Done.  (05/01/2003)   Hemoccult due: 04/30/2004    Colonoscopy: Not documented   Colonoscopy action/deferral: GI referral  (03/31/2010)  Other Screening   Pap smear: normal  (10/28/2007)   Pap smear due: 10/27/2010    Mammogram: Done.  (01/31/2005)   Mammogram due:  01/31/2006   Smoking status: never  (03/31/2010)  Lipids   Total Cholesterol: 186  (07/12/2008)   LDL: 123  (07/12/2008)   LDL Direct: 139  (03/22/2010)   HDL: 38  (07/12/2008)   Triglycerides: 127  (07/12/2008)    SGOT (AST): 19  (03/22/2010)   SGPT (ALT): 14  (03/22/2010)   Alkaline phosphatase: 65  (03/22/2010)   Total bilirubin: 0.4  (03/22/2010)    Lipid flowsheet reviewed?: Yes   Progress toward LDL goal: Unchanged  Hypertension   Last Blood Pressure: 143 / 83  (03/31/2010)   Serum creatinine: 0.99  (03/22/2010)   Serum potassium 4.0  (03/22/2010)    Hypertension flowsheet reviewed?: Yes   Progress toward BP goal: Improved  Self-Management Support :   Personal Goals (by the next clinic visit) :      Personal blood pressure goal: 140/90  (09/09/2009)     Personal LDL goal: 130  (03/31/2010)    Hypertension self-management support: BP self-monitoring log, Written self-care plan, Education handout  (03/22/2010)    Hypertension self-management support not done because: Good outcomes  (03/31/2010)    Lipid self-management support: Lipid monitoring log, Written self-care plan, Education handout  (03/31/2010)   Lipid self-care plan printed.   Lipid education handout printed    Self-management comments:  planning lifestyle modification trial for next 3 months for lipids  Laboratory Results   Blood Tests   Date/Time Received: March 31, 2010 9:52 AM  Date/Time Reported: March 31, 2010 10:17 AM   HGBA1C: 6.4%   (Normal Range: Non-Diabetic - 3-6%   Control Diabetic - 6-8%)  Comments: ...........test performed by...........Marland KitchenTerese Door, CMA

## 2011-01-30 NOTE — Progress Notes (Signed)
Summary: Rx Req  Phone Note Call from Patient Call back at Home Phone [phone]   Caller: Patient Summary of Call: Pt would like an rx for Tusinex sent to [facility] on [address] Initial call taken by: Clydell Hakim,  September 25, 2010 2:22 PM  Follow-up for Phone Call        States she has had a cough since the rain last week.  It is keeping her up at night.  No fever and only slightly productive.  Told her we would need to see her before prescribing anything.  Made her a [place] appt for tomorrow.   Follow-up by: Dennison Nancy RN,  September 25, 2010 3:25 PM

## 2011-01-30 NOTE — Progress Notes (Signed)
Summary: Rx's were picked up from pharmacy  Phone Note Outgoing Call Call back at [phone]   Call placed by: Linde Gillis CMA Duncan Dull),  August 02, 2010 4:59 PM Call placed to: [facility] Summary of Call: [facility] pharmacy and patient has picked up both Rx's, one for Lisinopril and one for HCTZ.  Both were picked up on 07/26/2010. Initial call taken by: Linde Gillis CMA Duncan Dull),  August 02, 2010 5:00 PM  Follow-up for Phone Call        Patient will not be a new patient of Dr. Dayton Martes.  She will continue to see the doctor at [facility].  Please see both phone notes dated for 08/03/2010 for further details. Follow-up by: Linde Gillis CMA Duncan Dull),  August 03, 2010 11:48 AM

## 2011-01-30 NOTE — Assessment & Plan Note (Signed)
Summary: f/up,tcb   Vital Signs:  Patient profile:   60 year old female Height:      68.5 inches Weight:      263 pounds BMI:     39.55 Pulse rate:   82 / minute BP sitting:   117 / 80  (left arm)  Vitals Entered By: Theresia Lo RN (August 30, 2010 3:31 PM) CC: ER follow up , BP follow up, Hypertension Management, Lipid Management Is Patient Diabetic? No Pain Assessment Patient in pain? yes     Location: back Intensity: 6 Type: aching   Primary Provider:  Ardyth Gal MD  CC:  ER follow up , BP follow up, Hypertension Management, and Lipid Management.  History of Present Illness: Pt. says she has still been having some tingling in her face, but no real numbness or word finding difficulty.  She wants to know how her BP is and if she should continue to take and asprin a day.    She says she was recently told by her pharmacist that she should not be taking Tramadol and Trazadone together.  She also takes cymbalta, but did not remember her medications today and does not know the dose of them.  She says she tried stopping the tramadol, but her back pain has been severe without it.    She says she has been very stressed lately, and has been denied for disability, but does not know what kind of job she can work.  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  She says her stress has made it hard for her to watch her diet and lose weight.   Hypertension History:      She denies headache, chest pain, peripheral edema, and syncope.  She notes no problems with any antihypertensive medication side effects.        Positive major cardiovascular risk factors include female age 60 years old or older, hyperlipidemia, and hypertension.  Negative major cardiovascular risk factors include non-tobacco-user status.    Lipid Management History:      Positive NCEP/ATP III risk factors include female  age 61 years old or older, HDL cholesterol less than 40, and hypertension.  Negative NCEP/ATP III risk factors include non-tobacco-user status.      Habits & Providers  Alcohol-Tobacco-Diet     Tobacco Status: never  Allergies: 1)  ! * Benazepril  Physical Exam  General:  Well-developed,well-nourished,in no acute distress; alert,appropriate and cooperative throughout examination Head:  Normocephalic and atraumatic without obvious abnormalities.  Eyes:  No corneal or conjunctival inflammation noted. EOMI. Perrla. Funduscopic exam benign, without hemorrhages, exudates or papilledema. Vision grossly normal. Mouth:  Oral mucosa and oropharynx without lesions or exudates.  Teeth in good repair. Neck:  No deformities, masses, or tenderness noted. Lungs:  Normal respiratory effort, chest expands symmetrically. Lungs are clear to auscultation, no crackles or wheezes. Heart:  Normal rate and regular rhythm. S1 and S2 normal without gallop, murmur, click, rub or other extra sounds. Abdomen:  Bowel sounds positive,abdomen soft and non-tender without masses, organomegaly or hernias noted. Pulses:  R and L carotid,radial,femoral,dorsalis pedis and posterior tibial pulses are full and equal bilaterally Neurologic:  No cranial nerve deficits noted.    Impression & Recommendations:  Problem # 1:  FACIAL WEAKNESS (ICD-781.94)  This seems to have resolved.  It does not seem that she had a CVA  or TIA, but her BP, blood sugar are well controlled, and her LDL is just a little above goal (139).  Advised continuing Asprin.   Orders: [facility]- Est  Level 4 ([address]214)  Problem # 2:  HYPERLIPIDEMIA, BORDERLINE (ICD-272.4)  Discussed with her that we need to do another fasting lipid panel soon and may need to be on a statin.   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 with chronic back pain, seeds Dr. Penni Bombard, taking Tramadol.  I think it is important  for her pain to be controlled so she can move around. She will see Dr. Penni Bombard for f/u of back pain next month.   Orders: [facility]- Est  Level 4 ([address])  Problem # 4:  ANXIETY (ICD-300.00) Pt. taking both Cymbalta and Trazadone, perscribed by Dr. Valentina Lucks at [facility].  She does seem to worry quite a bit, but I have asked her to speak to Dr. Valentina Lucks about trazadone and tramadol together, and if she thinks a different medication for sleep would be better or not.   Problem # 5:  OBESITY, NOS (ICD-278.00)  Pt. has gained weight over the past 6 months or so, and relates this to stress.  I advised her to continue to watch her diet, and not to use food as a stress reliever.    Orders: [facility]- Est  Level 4 ([address])  Complete Medication List: 1)  Lisinopril 20 Mg Tabs (Lisinopril) .... Take 1 tablet by mouth once a day 2)  Ibuprofen 800 Mg Tabs (Ibuprofen) .Marland Kitchen.. 1 tab by mouth three times a day as needed pain. 3)  Probation officer (Misc. devices) .... Use as directed. 4)  Hydrochlorothiazide 25 Mg Tabs (Hydrochlorothiazide) .... Take 1 tab by mouth every morning 5)  Tramadol Hcl 50 Mg Tabs (Tramadol hcl) .... Take 3 times daily for pain with extra strength tylenol 6)  Aspir-low 81 Mg Tbec (Aspirin)  Hypertension Assessment/Plan:      The patient's hypertensive risk group is category B: At least one risk factor (excluding diabetes) with no target organ damage.  Her calculated 10 year risk of coronary heart disease is 11 %.  Today's blood pressure is 117/80.  Her blood pressure goal is < 140/90.  Lipid Assessment/Plan:      Based on NCEP/ATP III, the patient's risk factor category is "2 or more risk factors and a calculated 10 year CAD risk of < 20%".  The patient's lipid goals are as follows: Total cholesterol goal is 200; LDL cholesterol goal is 130; HDL cholesterol goal is 40; Triglyceride goal is 150.  Her LDL cholesterol goal has not been met.  Secondary causes for hyperlipidemia have been ruled out.  She  has been counseled on adjunctive measures for lowering her cholesterol and has been provided with dietary instructions.    Patient Instructions: 1)  It was nice to meet you today.  I do not see any signs of stroke on my exam today.  However, it is important for you to keep your blood pressure, blood sugar, and cholesterol under control to help prevent a stroke.  I also suggest you keep taking a baby asprin (81 mg every day). 2)  I think it is important for your back pain to be controlled so you can do your daily activities, but it is also important to get a good night sleep.  Tramadol (for pain) and Trazadone (for sleep)can interact, but it does not sound like  you are having any side effects, so I think it is OK for you to take them both.  I would suggest that at your next visit with Dr. Valentina Lucks at [facility], you ask if she thinks there is any other medicine for sleep you could take that does not interact with Tramadol.     Vital Signs:  Patient profile:   60 year old female Height:      68.5 inches Weight:      263 pounds BMI:     39.55 Pulse rate:   82 / minute BP sitting:   117 / 80  (left arm)  Vitals Entered By: Theresia Lo RN (August 30, 2010 3:31 PM)

## 2011-01-30 NOTE — Progress Notes (Signed)
Summary: triage  Phone Note Call from Patient Call back at Home Phone [phone]   Caller: Patient Summary of Call: Pt has not taken her bp pills in a couple of days, but has been taking them for the past 3 days, but her ankle is swelling up.   Initial call taken by: Clydell Hakim,  May 01, 2010 11:46 AM  Follow-up for Phone Call        had to wait for her disability check to come in before she could afford the co-pays on her meds. had eaten 2 hotdogs then noticed her feet swelling. does not work. otherwise she feels fine.  advised sitting with feet propped up. do exercises such as dorsiflexion * drawing circles with her feet while they are up. no salt. drink more water. told her if she ever had difficulty getting the $4 copay, call me back.  told her if they have not gone down in 2 days, call me back for an appt Follow-up by: Golden Circle RN,  May 01, 2010 12:18 PM  Additional Follow-up for Phone Call Additional follow up Details #1::        agree Additional Follow-up by: Ancil Boozer  MD,  May 01, 2010 12:26 PM
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
## 2011-01-30 NOTE — Progress Notes (Signed)
       Additional Follow-up for Phone Call Additional follow up Details #2::    spoke with Aram Beecham with [facility] (Dr. Dayton Martes). they are not taking medicaid pts. states pt told her that she had been told that she was no longer a pt here? LM for pt to call me. she has a appt tomorrow with Dr. Wallene Huh. need to know more about that. also, if she has forms to be done, will need to bring to her appt tomorrow Follow-up by: Golden Circle RN,  August 03, 2010 11:26 AM  Additional Follow-up for Phone Call Additional follow up Details #3:: Details for Additional Follow-up Action Taken: pt called back. she wanted to follow Dr. Dayton Martes since she liked her & does not like getting assigned to a new md very year or 2. states when that happens, she has to come in to get forms done by new md. she understands that they do not take medicaid & is willing to stay here. has appt at 10am tomorrow & will bring the forms with her.  Additional Follow-up by: Golden Circle RN,  August 03, 2010 11:31 AM

## 2011-01-30 NOTE — Assessment & Plan Note (Signed)
Summary: eye twitching/ ACM   Vitals Entered By: Arlyss Repress CMA, (November 28, 2010 9:02 AM)  Primary Care Provider:  Ardyth Gal MD  CC:  eye twitching.  History of Present Illness: 60 yo F:  1. Eye Twitching: Right eye, x 5 days, about two times a day for a few seconds. Not really bothering her, but wants to make sure that it isn't stroke sign. Had recent w/u for CVA 2/2 facial numbness - negative. Taking ASA daily. No other focal neuro s/s. Normal strength. No numbness/tingling. No facial asymmetry. No other tremor. Endorses increased stress and decreased sleep recently. Was Rx Cymbalta 90 mg by mouth daily and Trazodone 50 mg by mouth q hs by [facility] but can't afford until Friday.  Current Medications (verified): 1)  Lisinopril 20 Mg Tabs (Lisinopril) .... Take 1 Tablet By Mouth Once A Day 2)  Ibuprofen 800 Mg Tabs (Ibuprofen) .Marland Kitchen.. 1 Tab By Mouth Three Times A Day As Needed Pain. 3)  International aid/development worker (Misc. Devices) .... Use As Directed. 4)  Hydrochlorothiazide 25 Mg  Tabs (Hydrochlorothiazide) .... Take 1 Tab By Mouth Every Morning 5)  Tramadol Hcl 50 Mg Tabs (Tramadol Hcl) .... Take 3 Times Daily For Pain With Extra Strength Tylenol 6)  Aspir-Low 81 Mg Tbec (Aspirin) 7)  Tussionex Pennkinetic Er 10-8 Mg/72ml Lqcr (Hydrocod Polst-Chlorphen Polst) .... Take 5 Ml Every 12 Hours As Needed For Cough.  Dispense 50 Ml.  Allergies (verified): 1)  ! * Benazepril PMH-FH-SH reviewed for relevance  Review of Systems General:  Denies chills, fatigue, fever, weakness, and weight loss. CV:  Denies chest pain or discomfort, palpitations, and shortness of breath with exertion. GI:  Denies abdominal pain, constipation, diarrhea, nausea, and vomiting. Derm:  Denies rash. Psych:  Denies suicidal thoughts/plans and thoughts /plans of harming others.  Physical Exam  General:  Well-developed, well-nourished, in no acute distress; alert, appropriate and cooperative throughout  examination. Vitals reviewed. Head:  Normocephalic and atraumatic without obvious abnormalities.  Eyes:  No corneal or conjunctival inflammation noted. EOMI. Perrla. Funduscopic exam benign, without hemorrhages, exudates or papilledema. Vision grossly normal. Ears:  R ear normal and L ear normal.   Nose:  External nasal examination shows no deformity or inflammation. Nasal mucosa are pink and moist without lesions or exudates. Mouth:  Oral mucosa and oropharynx without lesions or exudates.   Lungs:  Normal respiratory effort, chest expands symmetrically. Lungs are clear to auscultation, no crackles or wheezes. Heart:  Normal rate and regular rhythm. S1 and S2 normal without gallop, murmur, click, rub or other extra sounds. Pulses:  R and L dorsalis pedis and posterior tibial pulses are full and equal bilaterally. Neurologic:  Alert & oriented X3, cranial nerves II-XII intact, strength normal in all extremities, sensation intact to light touch, gait normal, and DTRs symmetrical and normal.     Impression & Recommendations:  Problem # 1:  TWITCHING (ICD-781.0) Assessment New  Likely 2/2 lack of sleep and increased stress. Advised starting Cymbalta and Trazodone. No neurological red flags. Follow up if not improving in one week. Red flags given. Will check TSH and electrolytes.  Orders: [facility]- Est Level  3 (16109)  Complete Medication List: 1)  Lisinopril 20 Mg Tabs (Lisinopril) .... Take 1 tablet by mouth once a day 2)  Ibuprofen 800 Mg Tabs (Ibuprofen) .Marland Kitchen.. 1 tab by mouth three times a day as needed pain. 3)  Probation officer (Misc. devices) .... Use as directed. 4)  Hydrochlorothiazide 25 Mg  Tabs (Hydrochlorothiazide) .... Take 1 tab by mouth every morning 5)  Tramadol Hcl 50 Mg Tabs (Tramadol hcl) .... Take 3 times daily for pain with extra strength tylenol 6)  Aspir-low 81 Mg Tbec (Aspirin) 7)  Tussionex Pennkinetic Er 10-8 Mg/76ml Lqcr (Hydrocod polst-chlorphen polst) .... Take 5 ml every 12  hours as needed for cough.  dispense 50 ml.  Other Orders: Basic Met-[facility] [id number]) TSH-[facility] [phone])  Patient Instructions: 1)  It was nice to meet you today! 2)  Your eye twitching is likely caused by stress and lack of sleep. Start the Trazodone and Cymbalta as soon as you can. 3)  We will check your electrolytes and thyroid today.   Orders Added: 1)  Basic Met-[facility] [[id number]] 2)  TSH-[facility] [[id number]] 3)  [facility]- Est Level  3 [44010]

## 2011-01-30 NOTE — Progress Notes (Signed)
Summary: phn msg  Phone Note Call from Patient Call back at [phone]   Caller: Patient Summary of Call: needs to talk to nurse about eyes twitching Initial call taken by: De Nurse,  November 22, 2010 10:58 AM  Follow-up for Phone Call        Right eyelid has been twitching for the past week.  No sensory changes, facial weaknes, or weakness in arms or leg.  Has been under a lot of stress lately.  Told her it was probably stress related.  Reviewed s&s of stroke and told her to present to ED if any of these occured.  If twitching not any better by next week to call us back and we would work her in.  Also advised to eat bananas since she in on a diuretic.  Pt agreeable. Follow-up by: Dennison Nancy RN,  November 22, 2010 11:21 AM

## 2011-01-30 NOTE — Progress Notes (Signed)
Summary: triage  Phone Note Call from Patient Call back at Home Phone [phone]   Caller: Patient Summary of Call: having numbness in left fingers since the weekend Initial call taken by: De Nurse,  March 21, 2010 4:08 PM  Follow-up for Phone Call        tingling in tip of finger of l hand 1st & 2nd fingers. elbow is numb as well. she sleeps on that side. started 2 days ago. work in at [employer]. denies problems with moving extremities, speech issues, face is symetrical. told her if any of these occur, go to ED. otherwise, will see her in am Follow-up by: Golden Circle RN,  March 21, 2010 4:11 PM

## 2011-02-01 NOTE — Progress Notes (Signed)
  Phone Note Call from Patient   Summary of Call: pt calls requesting tussinex for cough from URI.  reminded pt cannot perscribe over phone, pt should call to be seen monday.  pt agrees. Initial call taken by: Ellery Plunk MD,  December 09, 2010 4:49 PM

## 2011-02-01 NOTE — Miscellaneous (Signed)
  Clinical Lists Changes  Problems: Removed problem of TWITCHING (ICD-781.0) Removed problem of URI (ICD-465.9) Removed problem of FACIAL WEAKNESS (ICD-781.94) Removed problem of NUMBNESS, ARM (ICD-782.0)

## 2011-02-02 ENCOUNTER — Encounter: Payer: Self-pay | Admitting: Family Medicine

## 2011-02-02 ENCOUNTER — Ambulatory Visit: Admit: 2011-02-02 | Payer: Self-pay

## 2011-04-05 ENCOUNTER — Encounter: Payer: Self-pay | Admitting: Family Medicine

## 2011-04-05 ENCOUNTER — Ambulatory Visit (INDEPENDENT_AMBULATORY_CARE_PROVIDER_SITE_OTHER): Payer: Medicaid Other | Admitting: Family Medicine

## 2011-04-05 DIAGNOSIS — I10 Essential (primary) hypertension: Secondary | ICD-10-CM

## 2011-04-05 DIAGNOSIS — E669 Obesity, unspecified: Secondary | ICD-10-CM

## 2011-04-05 DIAGNOSIS — M1990 Unspecified osteoarthritis, unspecified site: Secondary | ICD-10-CM

## 2011-04-05 DIAGNOSIS — E785 Hyperlipidemia, unspecified: Secondary | ICD-10-CM

## 2011-04-05 MED ORDER — ACETAMINOPHEN 500 MG PO TABS: 1000.0000 mg | ORAL_TABLET | Freq: Three times a day (TID) | ORAL | Status: AC | PRN

## 2011-04-05 NOTE — Patient Instructions (Signed)
It was good to see you today.  I am concerned about your blood pressure as it is elevated today.  I want to see you again in one month to make sure it is getting better.  Please watch your diet- eat plenty of vegetables and watch how much salt you are eating.   We should check your cholesterol again soon- Please schedule a lab appointment for first thing in the morning any day next week.  Do not eat or drink anything before you come to have this lab checked. I want you to stop taking ibuprofen for your knee and back pain.  Tylenol is much safer and you can take two extra strength tylenol three times a day for the pain.

## 2011-04-08 ENCOUNTER — Encounter: Payer: Self-pay | Admitting: Family Medicine

## 2011-04-08 NOTE — Assessment & Plan Note (Signed)
Elevated today, but pt resistant to starting 3rd medication.  She will try to lose weight and watch her salt intake.  Follow up in 1 month to ensure BP is lower or to start medication if indicated.

## 2011-04-08 NOTE — Assessment & Plan Note (Signed)
Advised tramadol at night time since patient had drowsiness.  Will avoid scheduled NSAIDS long term.  Have explained to pt NSAIDS can harm kidneys and cause stomach bleeding.  Advised scheduling tylenol 1000 mg PO TID for her knee and back pain.

## 2011-04-08 NOTE — Progress Notes (Signed)
  Subjective:    Patient ID: Barbee Cough, female    DOB: [birth year], 60 y.o.   MRN: [id number]  HPI Francyne presents for follow up of her chronic medical problems.    HTN- pt says she is taking her HCTZ and Lisinopril as prescribed.  She denies any chest pain, shortness of breath, dizziness, or difficulty taking her medications.  She took them today.  She knows her blood pressure is higher than it should be today, but she really wants to try to lose some weight and see if it comes down, rather than starting a third medication.   Obesity- Pt says she has had difficulty with exercise to do her knee and back pain.  She tries to walk on the treadmill from time to time but has difficulty doing this on a regular basis.  She is not currently working, and money is tight, which she says makes buying healthy foods hard.    [place]line HLD- Pt knows her cholesterol has been elevated in the past and wonders if she should be on a medication for that.  She cannot remember when the last time it was checked was.   DJD- Pt continues to have pain in her knees and in her back.  She says she was taking ibuprofen for her knees for a while, which helped but then her prescription ran out.  She is wondering if she could have more.  She says that tramadol is "too strong" as it makes her sleepy.     Review of Systems  Constitutional: Negative for fever.  HENT: Negative for rhinorrhea.   Eyes: Negative for visual disturbance.  Respiratory: Negative for shortness of breath.   Cardiovascular: Negative for chest pain.  Gastrointestinal: Negative for abdominal pain.  Genitourinary: Negative for dysuria.  Musculoskeletal: Positive for back pain, joint swelling and arthralgias.  Skin: Negative for pallor.  Neurological: Negative for headaches.       Objective:   Physical Exam BP 152/86  Pulse 80  Temp(Src) 97.7 F (36.5 C) (Oral)  Wt 265 lb (120.203 kg) General appearance: alert, cooperative, no distress and  moderately obese Eyes: conjunctivae/corneas clear. PERRL, EOM's intact. Fundi benign. Throat: lips, mucosa, and tongue normal; teeth and gums normal Neck: no adenopathy, supple, symmetrical, trachea midline and thyroid not enlarged, symmetric, no tenderness/mass/nodules Lungs: clear to auscultation bilaterally Heart: regular rate and rhythm, S1, S2 normal, no murmur, click, rub or gallop Abdomen: soft, non-tender; bowel sounds normal; no masses,  no organomegaly Extremities: No LE edema, Knees: +bilateral crepitus, normal strength and sensation in bilateral LE.  Pulses: 2+ and symmetric        Assessment & Plan:

## 2011-04-08 NOTE — Assessment & Plan Note (Signed)
Reviewed portion control, plate size and distribution of food.  Encouraged pt to try to find inexpensive vegetables, and if buying canned to buy low sodium. Pt motivated to exercise more and lower blood pressure.

## 2011-04-08 NOTE — Assessment & Plan Note (Signed)
Pt has not had lipids checked in more than a year, will order fasting lipids to assess need for pharmacotherapy.

## 2011-05-03 ENCOUNTER — Other Ambulatory Visit: Payer: Self-pay | Admitting: *Deleted

## 2011-05-03 ENCOUNTER — Other Ambulatory Visit: Payer: Self-pay | Admitting: Family Medicine

## 2011-05-03 DIAGNOSIS — I10 Essential (primary) hypertension: Secondary | ICD-10-CM

## 2011-05-03 MED ORDER — LISINOPRIL 20 MG PO TABS: 20.0000 mg | ORAL_TABLET | Freq: Every day | ORAL | Status: DC

## 2011-06-04 ENCOUNTER — Other Ambulatory Visit: Payer: Medicaid Other

## 2011-06-04 DIAGNOSIS — E669 Obesity, unspecified: Secondary | ICD-10-CM

## 2011-06-04 LAB — LIPID PANEL
Cholesterol: 209 mg/dL — ABNORMAL HIGH (ref 0–200)
HDL: 41 mg/dL (ref >39)
Triglycerides: 86 mg/dL (ref <150)

## 2011-06-04 NOTE — Progress Notes (Signed)
FLP DONE TODAY Judith [name] 

## 2011-06-05 ENCOUNTER — Encounter: Payer: Self-pay | Admitting: Family Medicine

## 2011-06-12 ENCOUNTER — Ambulatory Visit (INDEPENDENT_AMBULATORY_CARE_PROVIDER_SITE_OTHER): Payer: Medicaid Other | Admitting: Family Medicine

## 2011-06-12 ENCOUNTER — Encounter: Payer: Self-pay | Admitting: Family Medicine

## 2011-06-12 DIAGNOSIS — Z0000 Encounter for general adult medical examination without abnormal findings: Secondary | ICD-10-CM

## 2011-06-12 DIAGNOSIS — E669 Obesity, unspecified: Secondary | ICD-10-CM

## 2011-06-12 DIAGNOSIS — M545 Low back pain: Secondary | ICD-10-CM

## 2011-06-12 DIAGNOSIS — I10 Essential (primary) hypertension: Secondary | ICD-10-CM

## 2011-06-12 DIAGNOSIS — E785 Hyperlipidemia, unspecified: Secondary | ICD-10-CM | POA: Insufficient documentation

## 2011-06-12 MED ORDER — SIMVASTATIN 20 MG PO TABS: 20.0000 mg | ORAL_TABLET | Freq: Every day | ORAL | Status: DC

## 2011-06-12 NOTE — Assessment & Plan Note (Signed)
Pt continues to have limitations in ability to exercise due to back pain.  Advised taking Tylenol 1000 mg PO TID.

## 2011-06-12 NOTE — Assessment & Plan Note (Signed)
Pt continues to try to make lifestyle modifications.  She has lost a few pounds.  Continue to encourage her.

## 2011-06-12 NOTE — Progress Notes (Signed)
  Subjective:    Patient ID: Judith [name], female    DOB: 1951/04/22, 60 y.o.   MRN: [id number]  HPI  Ms. Latka returns for follow up.  At her last visit her Cholesterol was checked and was elevated.  She states that her goal is to "eventually lose enough weight to get off all these medicines," but she is agreeable to starting a cholesterol medication.  Pt says she has not had difficulty with her blood pressure medications.  She does not regularly check her blood pressure at home.   Fynlee is still trying to lose weight by making lifestyle changes.  She is walking almost every day with her daughter in their neighborhood.  She is also trying to eat healthy.  She is excited that she has lost two pounds since her last visit.  Her back pain still limits her ability to walk and exercise.  She does not want to take medications with too many side effects.  She does say that she thinks her back pain is slightly less since losing some weight.   Review of Systems  Constitutional: Negative for fever.  HENT: Negative for rhinorrhea.   Eyes: Negative for visual disturbance.  Respiratory: Negative for shortness of breath.   Cardiovascular: Negative for chest pain.  Gastrointestinal: Negative for abdominal pain.  Genitourinary: Negative for difficulty urinating.  Musculoskeletal: Positive for back pain and arthralgias.  Skin: Negative for rash.  Neurological: Negative for dizziness.       Objective:   Physical Exam BP 136/89  Pulse 75  Wt 263 lb 4.8 oz (119.432 kg) General appearance: alert, cooperative, no distress and morbidly obese Eyes: conjunctivae/corneas clear. PERRL, EOM's intact. Fundi benign. Throat: lips, mucosa, and tongue normal; teeth and gums normal Neck: no adenopathy, no carotid bruit, no JVD, supple, symmetrical, trachea midline and thyroid not enlarged, symmetric, no tenderness/mass/nodules Back: symmetric, no curvature. ROM normal. No CVA tenderness., + TTP in lumbar areas  lateral to spine.  Lungs: clear to auscultation bilaterally Heart: regular rate and rhythm, S1, S2 normal, no murmur, click, rub or gallop Extremities: extremities normal, atraumatic, no cyanosis or edema Pulses: 2+ and symmetric       Assessment & Plan:

## 2011-06-12 NOTE — Patient Instructions (Signed)
It was good to see you.  I sent a prescription for a cholesterol medication called Simvastatin to your pharmacy.  We will recheck your cholesterol in about 6 months to make sure the medication is the right dose.   You can take Tylenol (aceteminopen) two extra srtength tablets (two 500 mg pills) three times a day for your arthritis.  The max dose of tylenol is 4,000 mg in one day.  Remember tylenol is in a lot of narcotic medications and over the counter cold medications.   Please make an appointment in about 3 months for your well woman exam.    The office will contact you about a colonoscopy.

## 2011-06-12 NOTE — Assessment & Plan Note (Signed)
Pt with elevated lipid panel last visit.  Will start Simvastatin and recheck cholesterol in 6 months.

## 2011-06-12 NOTE — Assessment & Plan Note (Signed)
Well-controlled, continue current medications

## 2011-06-28 ENCOUNTER — Telehealth: Payer: Self-pay | Admitting: Family Medicine

## 2011-06-28 MED ORDER — SIMVASTATIN 20 MG PO TABS: 20.0000 mg | ORAL_TABLET | Freq: Every day | ORAL | Status: AC

## 2011-06-28 NOTE — Telephone Encounter (Signed)
Is having a reaction to the cholesterol medication and would like to try the Brand instead of the generic.  [facility] [address].

## 2011-06-28 NOTE — Telephone Encounter (Signed)
I called the patient at home regarding her medication. She is no longer taking the simvastatin because it caused itching and fine raised rash (non-pustular, non-erythematous) on her head, arm and legs. She denies trouble breathing, swelling of lips, face or extremities. She states that she had a similar reaction to generic medications in the past and would like to try the same med in brand name.  Called [facility] and they can order brand name Zocor 20 mg 1 tab qHS. It will be available tomorrow.   -Dr. Armen Pickup.

## 2011-07-02 ENCOUNTER — Telehealth: Payer: Self-pay | Admitting: *Deleted

## 2011-07-02 NOTE — Telephone Encounter (Signed)
PA required for Zocor. Form placed in MD box.

## 2011-07-05 NOTE — Telephone Encounter (Signed)
This was sent to me by mistake

## 2011-07-10 NOTE — Telephone Encounter (Signed)
received notice from medicaid that PA has been denied, did not meet crtieria. They need further information.form placed back in MD box. They need MD to call [phone].

## 2011-07-13 NOTE — Telephone Encounter (Signed)
Dr. Lula Olszewski called Medicaid and Zocor has been approved for a year. Notified pharmacy and patient. Sent new rx to pharmacy with " brand name medically necessary  " written on rx.

## 2011-09-21 ENCOUNTER — Telehealth: Payer: Self-pay | Admitting: Family Medicine

## 2011-09-21 NOTE — Telephone Encounter (Signed)
FYI to MD

## 2011-09-21 NOTE — Telephone Encounter (Signed)
Judith [name] has called back and schedule her Physical.  She said she will just wait to get the Shingles immunization at that time.  Her appt is 10/4

## 2011-09-21 NOTE — Telephone Encounter (Signed)
Uses [facility] and was told that she would need approval to get the Shingles immunization through her pharmacy.  She wants to talk to someone about this.

## 2011-10-04 ENCOUNTER — Ambulatory Visit (INDEPENDENT_AMBULATORY_CARE_PROVIDER_SITE_OTHER): Payer: Medicaid Other | Admitting: Family Medicine

## 2011-10-04 ENCOUNTER — Encounter: Payer: Self-pay | Admitting: Family Medicine

## 2011-10-04 VITALS — BP 156/84 | HR 92 | Temp 98.0°F | Ht 68.5 in | Wt 265.5 lb

## 2011-10-04 DIAGNOSIS — I10 Essential (primary) hypertension: Secondary | ICD-10-CM

## 2011-10-04 DIAGNOSIS — E669 Obesity, unspecified: Secondary | ICD-10-CM

## 2011-10-04 DIAGNOSIS — E785 Hyperlipidemia, unspecified: Secondary | ICD-10-CM

## 2011-10-04 DIAGNOSIS — Z0000 Encounter for general adult medical examination without abnormal findings: Secondary | ICD-10-CM

## 2011-10-04 LAB — COMPREHENSIVE METABOLIC PANEL
ALT: 15 U/L (ref 0–35)
BUN: 11 mg/dL (ref 6–23)
CO2: 27 mEq/L (ref 19–32)
Calcium: 9 mg/dL (ref 8.4–10.5)
Chloride: 103 mEq/L (ref 96–112)
Creat: 1.04 mg/dL (ref 0.50–1.10)
Glucose, Bld: 97 mg/dL (ref 70–99)

## 2011-10-04 MED ORDER — ZOSTER VACCINE LIVE 19400 UNT/0.65ML ~~LOC~~ SOLR: 0.6500 mL | Freq: Once | SUBCUTANEOUS | Status: AC

## 2011-10-04 NOTE — Patient Instructions (Signed)
It was good to see you.  I have sent a prescription to your pharmacy for the shingles shot.  You also need to get a mammogram, please call the [facility] or the breast Imaging center to have one scheduled.   Please come back and see me in 3-6 months.

## 2011-10-07 DIAGNOSIS — Z0000 Encounter for general adult medical examination without abnormal findings: Secondary | ICD-10-CM | POA: Insufficient documentation

## 2011-10-07 NOTE — Assessment & Plan Note (Signed)
Pt recently started on statin for cholesterol, she is taking it without difficulty.  Will check LDL to see if any progress made and LFT's.

## 2011-10-07 NOTE — Assessment & Plan Note (Signed)
Pt to schedule mammogram, get Zostavax.  Discussed healthy lifestyle changes to help manage weight and chronic diseases.

## 2011-10-07 NOTE — Progress Notes (Signed)
  Subjective:    Patient ID: Judith [name], female    DOB: [birth year], 60 y.o.   MRN: [id number]  HPI  [name] presents for annual physical but declines a pap smear.  She is feeling well overall.  She has questions about getting the shingles shot because she has a neighbor who got shingles and has post herpatic neuralgia.  She says she does not want to get that. She already got her flu shot.   Patient has started taking simvastatin, is not having any symptomatic side effects.  However, she read that it can affect your liver, and is wondering if that needs to be checked on.   Patient reports that she is trying to watch her diet and trying to walk more, but she does have back pain that limits her.  Also she is on food stamps which makes it hard to afford healthy foods.   Review of Systems  Constitutional: Negative for unexpected weight change.  HENT: Negative for rhinorrhea.   Eyes: Negative for visual disturbance.  Respiratory: Negative for shortness of breath.   Cardiovascular: Negative for chest pain and leg swelling.  Gastrointestinal: Negative for abdominal pain and blood in stool.  Genitourinary: Negative for vaginal bleeding.  Musculoskeletal: Positive for back pain.  Skin: Negative for rash.  Neurological: Negative for dizziness.  Psychiatric/Behavioral: Negative for dysphoric mood.       Objective:   Physical Exam  Vitals reviewed. Constitutional: She is oriented to person, place, and time. She appears well-developed and well-nourished. No distress.  HENT:  Head: Normocephalic and atraumatic.  Eyes: Conjunctivae and EOM are normal. Pupils are equal, round, and reactive to light.  Neck: Normal range of motion. Neck supple. No thyromegaly present.  Cardiovascular: Normal rate, regular rhythm and normal heart sounds.   Pulmonary/Chest: Effort normal and breath sounds normal.  Abdominal: Soft. Bowel sounds are normal. There is no tenderness.  Musculoskeletal: She exhibits no  edema.  Neurological: She is alert and oriented to person, place, and time. No cranial nerve deficit.  Skin: Skin is warm and dry. No rash noted. No erythema.          Assessment & Plan:

## 2011-10-07 NOTE — Assessment & Plan Note (Signed)
Patient has gained about 10 lbs in the past year.  She is trying to eat healthier and get more exercise, but has some limitations related to finances and her back. Continue to encourage her to make healthy choices.

## 2011-10-07 NOTE — Assessment & Plan Note (Signed)
Relatively well controlled.  Will check labs to monitor while on chronic medications.  Continue meds at current dosages.

## 2011-10-12 ENCOUNTER — Encounter: Payer: Self-pay | Admitting: Family Medicine

## 2011-11-08 ENCOUNTER — Other Ambulatory Visit: Payer: Self-pay | Admitting: Family Medicine

## 2011-11-08 NOTE — Telephone Encounter (Signed)
Refill request

## 2011-11-14 ENCOUNTER — Ambulatory Visit (INDEPENDENT_AMBULATORY_CARE_PROVIDER_SITE_OTHER): Payer: Medicaid Other | Admitting: Family Medicine

## 2011-11-14 ENCOUNTER — Encounter: Payer: Self-pay | Admitting: Family Medicine

## 2011-11-14 VITALS — BP 145/82 | HR 91 | Temp 98.6°F | Ht 69.0 in | Wt 272.0 lb

## 2011-11-14 DIAGNOSIS — J069 Acute upper respiratory infection, unspecified: Secondary | ICD-10-CM | POA: Insufficient documentation

## 2011-11-14 MED ORDER — BENZONATATE 100 MG PO CAPS: 100.0000 mg | ORAL_CAPSULE | Freq: Three times a day (TID) | ORAL | Status: AC | PRN

## 2011-11-14 NOTE — Assessment & Plan Note (Signed)
Viral upper respiratory infection x 4 days. Discussed usual course and symptomatic treatment with patient. Given red flags to return. We will try Tessalon Perles as Tussionex did not have any relief but we also discussed that cough is difficult to treat and no treatment may be fully effective.

## 2011-11-14 NOTE — Progress Notes (Signed)
  Subjective:    Patient ID: Judith Judith [name], female    DOB: [birth year], 60 y.o.   MRN: [id number]  HPI 61 year old female presents for a same-day appointment to evaluate 4 days of Judith [name]  Patient states she has had Judith [name] with postnasal drainage and nasal congestion. No fever, sputum, dyspnea, nausea, vomiting, diarrhea. The Judith [name] keeps her up at night. She was prescribed Tussionex by another provider and she states this has not helped much. Otherwise she is able to continue to go to work. Has no significant fatigue, myalgias.   Review of SystemsGeneral:  Negative for fever, chills, malaise, myalgias HEENT: Negative for conjunctivitis, ear pain or drainage,  sore throat Respiratory:  Negative for , sputum, dyspnea Abdomen: Negative for abdominal pain, emesis, diarrhea Skin:  Negative for rash         Objective:   Physical Exam  GEN: Alert & Oriented, No acute distress HEENT: Judith [name]/AT. EOMI, PERRLA, no conjunctival injection or scleral icterus.  Bilateral tympanic membranes intact without erythema or effusion.  .  Nares without edema or rhinorrhea.  Oropharynx is without erythema or exudates.  No anterior or posterior cervical lymphadenopathy. CV:  Regular Rate & Rhythm, no murmur Respiratory:  Normal work of breathing, CTAB Abd:  + BS, soft, no tenderness to palpation Ext: no pre-tibial edema       Assessment & Plan:

## 2011-11-14 NOTE — Patient Instructions (Signed)
See info on cough and coldCough, Adult  A cough is a reflex. It helps you clear your throat and airways. A cough can help heal your body. A cough can last 2 or 3 weeks (acute) or may last more than 8 weeks (chronic). Some common causes of a cough can include an infection, allergy, or a cold. HOME CARE  Only take medicine as told by your doctor.     If given, take your medicines (antibiotics) as told. Finish them even if you start to feel better.     Use a cold steam vaporizer or humidier in your home. This can help loosen thick spit (secretions).     Sleep so you are almost sitting up (semi-upright). Use pillows to do this. This helps reduce coughing.     Rest as needed.     Stop smoking if you smoke.  GET HELP RIGHT AWAY IF:  You have yellowish-white fluid (pus) in your thick spit.     Your cough gets worse.     Your medicine does not reduce coughing, and you are losing sleep.     You cough up blood.     You have trouble breathing.     Your pain gets worse and medicine does not help.     You have a fever.  MAKE SURE YOU:    Understand these instructions.     Will watch your condition.     Will get help right away if you are not doing well or get worse.  Document Released: 08/30/2011 Document Reviewed: 06/25/2011 [facility] Patient Information 2012 [place], [place].

## 2011-11-16 ENCOUNTER — Telehealth: Payer: Self-pay | Admitting: Family Medicine

## 2011-11-16 MED ORDER — HYDROCOD POLST-CHLORPHEN POLST 10-8 MG/5ML PO LQCR: 5.0000 mL | Freq: Every evening | ORAL | Status: AC | PRN

## 2011-11-16 NOTE — Telephone Encounter (Signed)
I have signed an order in epic, will you please call in  tussionex for her.

## 2011-11-16 NOTE — Telephone Encounter (Signed)
Spoke with patient and she states tussinex did help some she just ran out . States she would take a tablesoonful and  It knocked out cough. Explained that directions were for 1 teaspoon every 12 hours if needed.   States she coughed all last night. Will forward to Dr. Earnest Bailey.

## 2011-11-16 NOTE — Telephone Encounter (Signed)
Left message on voicemail to call back  to discuss.  It was noted at visit on 11/14 with Dr. Earnest Bailey that tussinex was not working.  Will forward message to Dr. Earnest Bailey and await call back from patient.

## 2011-11-16 NOTE — Telephone Encounter (Signed)
Pt states that the benzonatate (TESSALON PERLES) 100 MG capsule Is not working and is wondering if she can get Tussinex rx or something similar  [facility] [address]

## 2011-11-16 NOTE — Telephone Encounter (Signed)
rx called to pharmacy and patient notified.

## 2012-02-19 ENCOUNTER — Telehealth: Payer: Self-pay | Admitting: Family Medicine

## 2012-02-19 NOTE — Telephone Encounter (Signed)
Want to discuss ? Yeast infection symptoms.

## 2012-02-19 NOTE — Telephone Encounter (Signed)
Returned call to patient.  States she has been having blood in urine.  Offered appt for today.  Patient declined and wants appt for tomorrow.  Appt given for tomorrow with crosscover MD for 8:30 am.  Gaylene Brooks, RN

## 2012-02-20 ENCOUNTER — Ambulatory Visit: Payer: Medicaid Other

## 2012-02-20 ENCOUNTER — Ambulatory Visit (INDEPENDENT_AMBULATORY_CARE_PROVIDER_SITE_OTHER): Payer: Medicaid Other | Admitting: Family Medicine

## 2012-02-20 DIAGNOSIS — R51 Headache: Secondary | ICD-10-CM

## 2012-02-20 NOTE — Progress Notes (Signed)
Subjective:     Patient ID: Judith [name], female   DOB: 12/06/1951, 61 y.o.   MRN: [id number]  HPI Cancelled appt.   Review of Systems     Objective:   Physical Exam     Assessment:         Plan:

## 2012-02-21 ENCOUNTER — Ambulatory Visit (INDEPENDENT_AMBULATORY_CARE_PROVIDER_SITE_OTHER): Payer: Medicaid Other | Admitting: Family Medicine

## 2012-02-21 ENCOUNTER — Encounter: Payer: Self-pay | Admitting: Family Medicine

## 2012-02-21 DIAGNOSIS — R319 Hematuria, unspecified: Secondary | ICD-10-CM

## 2012-02-21 DIAGNOSIS — R3989 Other symptoms and signs involving the genitourinary system: Secondary | ICD-10-CM

## 2012-02-21 LAB — POCT URINALYSIS DIPSTICK
Bilirubin, UA: NEGATIVE
Leukocytes, UA: NEGATIVE
Spec Grav, UA: 1.025
pH, UA: 6

## 2012-02-21 NOTE — Assessment & Plan Note (Signed)
Patient is history of a couple [place] with hematuria. UA today does not show any significant hematuria or any signs of infection. Discussed with patient that this could any kidney stone versus infection that she has started treated. Discussed the possibility of it being constipation the patient denies any straining with bowel movements. At this time we'll make no changes in her care told her to come back in if this does recur. Gave her red flags to look out for and when to seek medical attention. Patient will also followup with her primary care provider in the next 2-3 weeks for followup of her blood pressure.

## 2012-02-21 NOTE — Patient Instructions (Addendum)
It is very nice to meet you. At this time it does not appear that you have a urinary tract infection. I do not know what the blood was from but at this time it is gone. If it returns please come back and see [place]. I'm giving you some information about incontinence.  Try to avoid caffeine this may help as well. Probably would be a good idea for you to followup with her primary care physician in the next 2 weeks.

## 2012-02-21 NOTE — Progress Notes (Signed)
  Subjective:    Patient ID: Judith [name], female    DOB: Jul 03, 1951, 61 y.o.   MRN: [id number]  HPI 61 year old female coming in with hematuria x3 days. Patient noticed that on Monday had it during 2 different leads. After that though it has been gone but she was concerned not to come in to have it checked. Patient has been taking all her medications, states that she's been feeling well maybe a little bit of suprapubic tenderness from time to time. Patient denies any type of fever, chills, nausea, vomiting or diarrhea or constipation. Patient stopped menstruating approximately 2 years ago patient also denies any vaginal discharge. Patient also denies any abnormal weight loss. Patient has a history of one urinary tract infection when she was a kid otherwise has been normal. Patient denies any type of family history of kidney cancer.   Review of Systems As stated above    Objective:   Physical Exam Vitals reviewed. Constitutional: She is oriented to person, place, and time. She appears well-developed and well-nourished. No distress.  Eyes: Conjunctivae and EOM are normal. Pupils are equal, round, and reactive to light.  Neck: Normal range of motion. Neck supple. No thyromegaly present.  Cardiovascular: Normal rate, regular rhythm and normal heart sounds.   Pulmonary/Chest: Effort normal and breath sounds normal.  Abdominal: Soft. Bowel sounds are normal. There is no tenderness.  Musculoskeletal: She exhibits no edema.  Neurological: She is alert and oriented to person, place, and time. No cranial nerve deficit.  Skin: Skin is warm and dry. No rash noted. No erythema.     Assessment & Plan:

## 2012-03-10 ENCOUNTER — Ambulatory Visit: Payer: Medicaid Other | Admitting: Family Medicine

## 2012-04-02 ENCOUNTER — Other Ambulatory Visit: Payer: Self-pay | Admitting: Family Medicine

## 2012-05-06 ENCOUNTER — Ambulatory Visit: Payer: Medicaid Other | Admitting: Family Medicine

## 2012-05-19 ENCOUNTER — Ambulatory Visit: Payer: Medicaid Other | Admitting: Family Medicine

## 2012-05-30 ENCOUNTER — Other Ambulatory Visit: Payer: Self-pay | Admitting: Family Medicine

## 2012-07-25 ENCOUNTER — Ambulatory Visit (INDEPENDENT_AMBULATORY_CARE_PROVIDER_SITE_OTHER): Payer: Medicaid Other | Admitting: Family Medicine

## 2012-07-25 ENCOUNTER — Encounter: Payer: Self-pay | Admitting: Family Medicine

## 2012-07-25 VITALS — BP 163/87 | HR 101 | Temp 98.5°F | Ht 69.0 in | Wt 259.4 lb

## 2012-07-25 DIAGNOSIS — D649 Anemia, unspecified: Secondary | ICD-10-CM

## 2012-07-25 DIAGNOSIS — I10 Essential (primary) hypertension: Secondary | ICD-10-CM

## 2012-07-25 DIAGNOSIS — R253 Fasciculation: Secondary | ICD-10-CM

## 2012-07-25 DIAGNOSIS — E669 Obesity, unspecified: Secondary | ICD-10-CM

## 2012-07-25 DIAGNOSIS — R259 Unspecified abnormal involuntary movements: Secondary | ICD-10-CM

## 2012-07-25 DIAGNOSIS — E785 Hyperlipidemia, unspecified: Secondary | ICD-10-CM

## 2012-07-25 LAB — BASIC METABOLIC PANEL
BUN: 11 mg/dL (ref 6–23)
CO2: 27 mEq/L (ref 19–32)
Chloride: 103 mEq/L (ref 96–112)
Creat: 1.06 mg/dL (ref 0.50–1.10)
Glucose, Bld: 93 mg/dL (ref 70–99)
Potassium: 3.6 mEq/L (ref 3.5–5.3)

## 2012-07-25 LAB — CBC
HCT: 32.4 % — ABNORMAL LOW (ref 36.0–46.0)
Hemoglobin: 10.9 g/dL — ABNORMAL LOW (ref 12.0–15.0)
MCV: 92 fL (ref 78.0–100.0)
WBC: 6.2 10*3/uL (ref 4.0–10.5)

## 2012-07-25 LAB — LIPID PANEL
LDL Cholesterol: 141 mg/dL — ABNORMAL HIGH (ref 0–99)
VLDL: 29 mg/dL (ref 0–40)

## 2012-07-25 LAB — TSH: TSH: 0.881 u[IU]/mL (ref 0.350–4.500)

## 2012-07-25 NOTE — Patient Instructions (Signed)
It was good to see you.  Your blood pressure today was BP: 163/87 mmHg.  Remember your goal blood pressure is about 120/80.  Please be sure to take your medication every day.  Please make a nurse visit to have your blood pressure checked next week.    I will send you a letter with your lab results.  Please come back and see me in about 3 months.

## 2012-07-26 DIAGNOSIS — R253 Fasciculation: Secondary | ICD-10-CM | POA: Insufficient documentation

## 2012-07-26 NOTE — Assessment & Plan Note (Signed)
Will recheck lipid profile  

## 2012-07-26 NOTE — Assessment & Plan Note (Signed)
Elevated today, but patient does not want medication changes.  She agrees to come in for nurse visit in one week to see if it improves.  Will follow up, make medication changes if needed.

## 2012-07-26 NOTE — Progress Notes (Signed)
  Subjective:    Patient ID: Judith [name], female    DOB: [birth year], 61 y.o.   MRN: [id number]  HPI  Judith [name] comes in for follow up.    HTN- she says she has been rushing around this morning and just took her BP medications before coming to the office.  She denies any chest pain, dyspnea, LE swelling or palpitations.  She says she has checked her BP at home and has not seen a number over 140/85.    Obesity- she has been working very hard at her diet as she has arthritis and cannot exercise much.  She has cut out a lot of starches, and has lost about 10 lbs since February.    Eye Twitching- She says this has been going on for several months, that her left eye twitches spontaneously.  She says it is annoying.  She does not twitch anywhere else, and denies any focal neurological problems.   HDL- diagnosed and started on simvastatin about one year ago. Again, is watching her diet and has lost weight., but has not had lipids checked in about a one year.   Anemia- has hx of anemia, tried to take iron supplements but they make her too constipated.   Review of Systems See HPI.     Objective:   Physical Exam BP 163/87  Pulse 101  Temp 98.5 F (36.9 C) (Oral)  Ht 5\' 9"  (1.753 m)  Wt 259 lb 6 oz (117.652 kg)  BMI 38.30 kg/m2 General appearance: alert, cooperative and no distress Eyes: conjunctivae/corneas clear. PERRL, EOM's intact. Fundi benign. Neck: no adenopathy, no JVD, supple, symmetrical, trachea midline and thyroid not enlarged, symmetric, no tenderness/mass/nodules Lungs: clear to auscultation bilaterally Heart: regular rate and rhythm, S1, S2 normal, no murmur, click, rub or gallop Pulses: 2+ and symmetric Neurologic: Cranial nerves: normal       Assessment & Plan:

## 2012-07-26 NOTE — Assessment & Plan Note (Signed)
Patient has successfully lost 10 lbs- congratulated and encouraged her to keep up the lifestyle changes.

## 2012-07-26 NOTE — Assessment & Plan Note (Signed)
Unclear cause- but no focal neuro deficits.  Will check electrolytes, kidney function CBC to see if any lab abnormalities may be contributing.

## 2012-07-26 NOTE — Assessment & Plan Note (Signed)
Will check CBC to ensure she is not too anemic.

## 2012-07-28 ENCOUNTER — Telehealth: Payer: Self-pay | Admitting: Family Medicine

## 2012-07-28 NOTE — Telephone Encounter (Signed)
Wants to know results of her tests from last visit

## 2012-07-28 NOTE — Telephone Encounter (Signed)
Called Judith [name]- told her electrolytes, kidney function, thyroid were normal.  Hemoglobin is low but stable.  Cholesterol mildly elevated, but improves since last year with weight loss.  Patient says she will try to cut out some cholesterol in her diet, does not want to start cholesterol pill.

## 2012-07-28 NOTE — Telephone Encounter (Signed)
Will forward to Dr. Chamberlain 

## 2012-10-08 ENCOUNTER — Other Ambulatory Visit: Payer: Self-pay | Admitting: Family Medicine

## 2012-12-04 ENCOUNTER — Ambulatory Visit (INDEPENDENT_AMBULATORY_CARE_PROVIDER_SITE_OTHER): Payer: Medicaid Other | Admitting: *Deleted

## 2012-12-04 VITALS — BP 130/88 | HR 88

## 2012-12-04 DIAGNOSIS — I10 Essential (primary) hypertension: Secondary | ICD-10-CM

## 2012-12-04 DIAGNOSIS — Z23 Encounter for immunization: Secondary | ICD-10-CM

## 2012-12-04 NOTE — Progress Notes (Signed)
Patient in for flu vaccine and requests BP check. BP checked manually using regular adult cuff. BP LA 150/90 and RA 130/88 pulse 88.  Medications reviewed . Appointment scheduled with PCP for BP follow up.

## 2012-12-08 ENCOUNTER — Ambulatory Visit: Payer: Medicaid Other | Admitting: Family Medicine

## 2012-12-24 ENCOUNTER — Telehealth: Payer: Self-pay | Admitting: Emergency Medicine

## 2012-12-24 NOTE — Telephone Encounter (Signed)
Patient called the emergency line for numbness and tingling in her left ring and pinkie finger x3 days.  The numbness is limited to the fingers and a little in her forearm.  Denies any trauma to the elbow, but does often sleep on it funny.  Denies any weakness or swelling in the hand.  No pain in the hand or arm.  Does also have some back pain between her shoulder blades and that comes and goes.  Does not radiate and no weakness.  Offered her f/u in clinic tomorrow morning which she agrees with.  I was unable to make an appointment for her so she will call the clinic in the morning.  Reviewed warning signs that would warrant going to the ED.

## 2012-12-29 ENCOUNTER — Telehealth: Payer: Self-pay | Admitting: Family Medicine

## 2012-12-29 NOTE — Telephone Encounter (Signed)
Patient calling to discuss with nurse tingling of fingers on left hand.

## 2012-12-29 NOTE — Telephone Encounter (Signed)
Patient spoke with Dr. Elwyn Reach  on 12/20 about tingling in fingers. Was advised to call for appointment for work in the next day but patient states she was feeling better and did not call. Now discomfort started again yesterday.  Since we have no available appointment left today advised patient to go to Urgent Care now for evaluation. She voices understanding.

## 2012-12-31 ENCOUNTER — Telehealth: Payer: Self-pay | Admitting: Family Medicine

## 2012-12-31 NOTE — Telephone Encounter (Signed)
Received after hours Emergency line call.  Patient has been having intermittent left arm/hand numbness for about 3 days.  She says it is not worse with exertion, does not improve with rest.  She has no associated neck pain, nausea, diaphoresis, chest pain, dyspnea.  She thinks her blood pressure has been OK.  She says she called and made an appointment on 12/30, but then it went away so she did not come.   I advised that left arm tingling could be a sign of a heart attack or other heart trouble, vs. MSK injury to neck or hand- no way to tell for sure over the phone.  Advised that if patient feels it is unchanged for several days and not getting worse, can call clinic in am for work-in visit.  However, if she has any of the above associated symptoms, it is getting worse or becomes exertional she should go to ER.  She says she thinks she will just go to clinic in am when we open and ask for a work in visit.

## 2013-01-01 ENCOUNTER — Other Ambulatory Visit: Payer: Self-pay | Admitting: Family Medicine

## 2013-01-01 ENCOUNTER — Ambulatory Visit (INDEPENDENT_AMBULATORY_CARE_PROVIDER_SITE_OTHER): Payer: Medicaid Other | Admitting: Family Medicine

## 2013-01-01 ENCOUNTER — Encounter: Payer: Self-pay | Admitting: Family Medicine

## 2013-01-01 VITALS — BP 159/95 | HR 95 | Temp 98.3°F | Ht 68.5 in | Wt 241.0 lb

## 2013-01-01 DIAGNOSIS — E876 Hypokalemia: Secondary | ICD-10-CM

## 2013-01-01 DIAGNOSIS — M62838 Other muscle spasm: Secondary | ICD-10-CM | POA: Insufficient documentation

## 2013-01-01 LAB — BASIC METABOLIC PANEL
BUN: 12 mg/dL (ref 6–23)
CO2: 31 mEq/L (ref 19–32)
Glucose, Bld: 101 mg/dL — ABNORMAL HIGH (ref 70–99)
Potassium: 3.7 mEq/L (ref 3.5–5.3)

## 2013-01-01 MED ORDER — IBUPROFEN 600 MG PO TABS: 600.0000 mg | ORAL_TABLET | Freq: Three times a day (TID) | ORAL | Status: AC | PRN

## 2013-01-01 MED ORDER — BACLOFEN 10 MG PO TABS: 10.0000 mg | ORAL_TABLET | Freq: Three times a day (TID) | ORAL | Status: DC

## 2013-01-01 NOTE — Patient Instructions (Addendum)
Come back in about 2 weeks so we can see how you're doing.  Take the Baclofen as a muscle relaxer.  Do not drive with this medicine.  Take the Ibuprofen 2-3 times a day for pain and inflammation.    Have a good New Year!
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
## 2013-01-01 NOTE — Assessment & Plan Note (Signed)
Present for only 7 days. Baclofen to treat as muscle aches. Ibuprofen to treat her pain and inflammation. I do note hypertension and therefore will limit her NSAID use to short-term. Heat massage as needed. Gave red flags her to return to clinic or emergency department. Followup in 2 weeks to assess for improvement.

## 2013-01-02 ENCOUNTER — Telehealth: Payer: Self-pay | Admitting: Family Medicine

## 2013-01-02 NOTE — Telephone Encounter (Signed)
Patient requested labs to check potassium and calcium.  Called and relayed these were within normal limits.  Patient appreciated call.

## 2013-01-13 ENCOUNTER — Telehealth: Payer: Self-pay | Admitting: Family Medicine

## 2013-01-13 NOTE — Telephone Encounter (Signed)
Spoke to pt,she states she's having very little swelling and some tingling on her left ring finger.pt thought these symptoms were related to heart disease.She has an appt  on Thursday 01/15/2013 with [place] for a follow up for this.i assured  pt no need for cardiology consult only to keep her appt and  to call earlier if not better.gp

## 2013-01-13 NOTE — Telephone Encounter (Signed)
Patient would like to speak to the nurse about the symptoms she is having with her fingers.  She is requesting a referral to a Cardiologist to be sure she doesn't have any blood clots.

## 2013-01-15 ENCOUNTER — Encounter: Payer: Self-pay | Admitting: Family Medicine

## 2013-01-15 ENCOUNTER — Ambulatory Visit (INDEPENDENT_AMBULATORY_CARE_PROVIDER_SITE_OTHER): Payer: Medicaid Other | Admitting: Family Medicine

## 2013-01-15 VITALS — BP 142/78 | HR 108 | Temp 98.8°F | Ht 68.5 in | Wt 243.0 lb

## 2013-01-15 DIAGNOSIS — E669 Obesity, unspecified: Secondary | ICD-10-CM

## 2013-01-15 DIAGNOSIS — I10 Essential (primary) hypertension: Secondary | ICD-10-CM

## 2013-01-15 DIAGNOSIS — M62838 Other muscle spasm: Secondary | ICD-10-CM

## 2013-01-15 MED ORDER — NAPROXEN 500 MG PO TABS: 500.0000 mg | ORAL_TABLET | Freq: Two times a day (BID) | ORAL | Status: AC

## 2013-01-15 MED ORDER — WRIST SPLINT/COCK-UP/LEFT L MISC: 1.0000 | Freq: Once | Status: AC

## 2013-01-15 NOTE — Patient Instructions (Signed)
I am sorry your hand and fingers are numb.  I think you have an ulnar nerve compression- either from sleeping or repetitive activities.  Please try the wrist brace, avoid putting pressure on your elbow or wrist when sleeping or during the day.  Please take the naproxen twice daily for a week.  Let me know if you do not get better.

## 2013-01-16 NOTE — Assessment & Plan Note (Signed)
Possible cause of finger numbness, vs. Ulnar entrapment/irritation distally- on exam today she is somewhat improved, did discuss trying not to stress elbow/wrist/shoulder while sleeping, will try wrist brace and schedule NSAIDS for one more week, f/u if not improving.

## 2013-01-16 NOTE — Progress Notes (Signed)
  Subjective:    Patient ID: Judith [name], female    DOB: 02/26/1951, 62 y.o.   MRN: [id number]  HPI  Judith [name] comes in for follow up of her left hand/finger numbness and tingling.  This has been happening on and off for about 3 weeks.  She says it is not made worse by exerting herself, nor does she have associated chest pain, diaphoresis, nausea.    She was seen by Dr. Gwendolyn Grant two weeks ago who felt this was coming from a muscle spasm in her neck.  It has improved some with ibuprofen and baclofen, but is still bothering her some.  She denies any neck pain now.  She associates the pain with sleeping wrong, and says it is still only in her 4th and 5th fingers.  She denies any trauma to her wrist, shoulder, or elbow.   Obesity- has been watching diet, eating a lot more veggies, no bread, limiting meats.  Has been walking for exercise, is very excited as many of her clothes are too big.    I have reviewed the patient's medical history in detail and updated the computerized patient record.  Review of Systems See HPI    Objective:   Physical Exam BP 142/78  Pulse 108  Temp 98.8 F (37.1 C) (Oral)  Ht 5' 8.5" (1.74 m)  Wt 243 lb (110.224 kg)  BMI 36.41 kg/m2 General appearance: alert, cooperative and no distress Neck: Normal ROM in all directions, normal strength of neck muscles, no palpable tenderness, Spurling's negative.  Lungs: clear to auscultation bilaterally Heart: regular rate and rhythm, S1, S2 normal, no murmur, click, rub or gallop Neuro: Normal sensation of hands There is 4/5 strength of 4th and 5th digit of left hand in abduction compared to right Normal bilateral grip strength, normal 1st and second digit strength for "OK" sign bilaterally Normal and symmetric reflexes of upper extremities bilaterally Shoulder non-tender to palpation, with normal rotator cuff strength and full ROM bilaterally Elbow atraumatic, no tenderness to palpation, normal flexor and extensor strength.         Assessment & Plan:

## 2013-01-16 NOTE — Assessment & Plan Note (Signed)
Acceptable control on current regimen despite course of NSAIDS, will continue to monitor.

## 2013-01-16 NOTE — Assessment & Plan Note (Signed)
Congratulated her on her weight loss- is down >20 lbs from highest weight.  Encouraged her to continue to make lifestyle modifications.

## 2013-01-28 ENCOUNTER — Other Ambulatory Visit: Payer: Self-pay | Admitting: Family Medicine

## 2013-01-29 ENCOUNTER — Other Ambulatory Visit: Payer: Self-pay | Admitting: Family Medicine

## 2013-12-09 ENCOUNTER — Other Ambulatory Visit: Payer: Self-pay | Admitting: Family Medicine

## 2013-12-09 DIAGNOSIS — R922 Inconclusive mammogram: Secondary | ICD-10-CM

## 2013-12-17 ENCOUNTER — Other Ambulatory Visit: Payer: Medicaid Other

## 2015-07-08 ENCOUNTER — Encounter (HOSPITAL_COMMUNITY): Payer: Self-pay | Admitting: *Deleted

## 2015-07-08 ENCOUNTER — Emergency Department (HOSPITAL_COMMUNITY)
Admission: EM | Admit: 2015-07-08 | Discharge: 2015-07-08 | Disposition: A | Discharge status: Home / Self Care | Payer: Medicaid Other | Attending: Emergency Medicine | Admitting: Emergency Medicine

## 2015-07-08 ENCOUNTER — Emergency Department (HOSPITAL_COMMUNITY): Payer: Medicaid Other

## 2015-07-08 DIAGNOSIS — I10 Essential (primary) hypertension: Secondary | ICD-10-CM | POA: Diagnosis not present

## 2015-07-08 DIAGNOSIS — Z791 Long term (current) use of non-steroidal anti-inflammatories (NSAID): Secondary | ICD-10-CM | POA: Diagnosis not present

## 2015-07-08 DIAGNOSIS — E785 Hyperlipidemia, unspecified: Secondary | ICD-10-CM | POA: Diagnosis not present

## 2015-07-08 DIAGNOSIS — Z7982 Long term (current) use of aspirin: Secondary | ICD-10-CM | POA: Insufficient documentation

## 2015-07-08 DIAGNOSIS — Z79899 Other long term (current) drug therapy: Secondary | ICD-10-CM | POA: Diagnosis not present

## 2015-07-08 DIAGNOSIS — I671 Cerebral aneurysm, nonruptured: Secondary | ICD-10-CM | POA: Diagnosis not present

## 2015-07-08 DIAGNOSIS — R51 Headache: Secondary | ICD-10-CM

## 2015-07-08 DIAGNOSIS — M1990 Unspecified osteoarthritis, unspecified site: Secondary | ICD-10-CM | POA: Insufficient documentation

## 2015-07-08 DIAGNOSIS — R519 Headache, unspecified: Secondary | ICD-10-CM

## 2015-07-08 LAB — BASIC METABOLIC PANEL
ANION GAP: 8 (ref 5–15)
BUN: 8 mg/dL (ref 6–20)
CALCIUM: 9 mg/dL (ref 8.9–10.3)
CO2: 28 mmol/L (ref 22–32)
CREATININE: 0.91 mg/dL (ref 0.44–1.00)
Chloride: 101 mmol/L (ref 101–111)
GFR calc Af Amer: >60 mL/min (ref >60)
GFR calc non Af Amer: >60 mL/min (ref >60)
Glucose, Bld: 96 mg/dL (ref 65–99)
Potassium: 3.4 mmol/L — ABNORMAL LOW (ref 3.5–5.1)
Sodium: 137 mmol/L (ref 135–145)

## 2015-07-08 LAB — CBC
HCT: 34.4 % — ABNORMAL LOW (ref 36.0–46.0)
HEMOGLOBIN: 11.3 g/dL — AB (ref 12.0–15.0)
MCH: 31.2 pg (ref 26.0–34.0)
MCHC: 32.8 g/dL (ref 30.0–36.0)
MCV: 95 fL (ref 78.0–100.0)
PLATELETS: 338 10*3/uL (ref 150–400)
RBC: 3.62 MIL/uL — AB (ref 3.87–5.11)
RDW: 14.1 % (ref 11.5–15.5)
WBC: 6.6 10*3/uL (ref 4.0–10.5)

## 2015-07-08 LAB — I-STAT TROPONIN, ED: Troponin i, poc: 0 ng/mL (ref 0.00–0.08)

## 2015-07-08 MED ORDER — PROCHLORPERAZINE EDISYLATE 5 MG/ML IJ SOLN
10.0000 mg | Freq: Once | INTRAMUSCULAR | Status: AC
  Administered 2015-07-08: 10 mg via INTRAVENOUS
  Filled 2015-07-08: qty 2

## 2015-07-08 MED ORDER — SODIUM CHLORIDE 0.9 % IV BOLUS (SEPSIS)
1000.0000 mL | Freq: Once | INTRAVENOUS | Status: AC
  Administered 2015-07-08: 1000 mL via INTRAVENOUS

## 2015-07-08 MED ORDER — AMLODIPINE BESYLATE 10 MG PO TABS: 5.0000 mg | ORAL_TABLET | Freq: Every day | ORAL | Status: AC

## 2015-07-08 MED ORDER — DIPHENHYDRAMINE HCL 50 MG/ML IJ SOLN
12.5000 mg | Freq: Once | INTRAMUSCULAR | Status: AC
  Administered 2015-07-08: 12.5 mg via INTRAVENOUS
  Filled 2015-07-08: qty 1

## 2015-07-08 MED ORDER — IOHEXOL 350 MG/ML SOLN
50.0000 mL | Freq: Once | INTRAVENOUS | Status: AC | PRN
  Administered 2015-07-08: 50 mL via INTRAVENOUS

## 2015-07-08 NOTE — ED Notes (Signed)
PA in room with patient and family at this time.

## 2015-07-08 NOTE — ED Notes (Signed)
Patient/family report patient is to be discharged soon. IV fluids noted to have been discontinued by family member. Patient unwilling to have IV fluids restarted.

## 2015-07-08 NOTE — ED Notes (Signed)
Patient transported to CT 

## 2015-07-08 NOTE — ED Notes (Signed)
Pt reports having headache and htn, reports bp 192/110 at home. bp is 154/87 at triage. No acute distress noted at triage.

## 2015-07-08 NOTE — ED Provider Notes (Signed)
CSN: [id number]     Arrival date & time 07/08/15  1552 History   First MD Initiated Contact with Patient 07/08/15 1712     Chief Complaint  Patient presents with  . Hypertension  . Headache     (Consider location/radiation/quality/duration/timing/severity/associated sxs/prior Treatment) The history is provided by the patient and medical records. No language interpreter was used.     Judith [name] is a 63 y.o. female  with a hx of  HTN, hyperlipidemia, arthritis presents to the Emergency Department complaining of gradual, persistent, progressively worsening frontal headache onset 3 days ago after working in the heat. Associated symptoms include lightheaded, denies dizziness.  Pt reports taking 2 tylenol and her HTN medication without relief of her headache.  Pt reports similar headache in the 1980s which was attributed to stress.  Pt reports that her BP normally runs 140-150 systolic and it always comes down in the office.  Pt reports she took her BP with a home machine that read 192/110.  She reports it is sometimes irregular.  Pt reports she has not been back in the heat for prolonged times since then.  Pt reports she is urinating a pale yellow without blood or brown discoloration.  Nothing makes her headache better and nothing makes it worse.  Pt denies fever, chills, neck pain, chest pain, SOB, abd pain, N/V/D, weakness, numbness, syncope, dysuria.     Past Medical History  Diagnosis Date  . Hyperlipidemia   . Hypertension   . Arthritis    History reviewed. No pertinent past surgical history. Family History  Problem Relation Age of Onset  . Diabetes Daughter   . Hyperlipidemia Daughter   . Hypertension Daughter   . Kidney disease Daughter    History  Substance Use Topics  . Smoking status: Never Smoker   . Smokeless tobacco: Not on file  . Alcohol Use: No   OB History    No data available     Review of Systems  Constitutional: Negative for fever, diaphoresis, appetite  change, fatigue and unexpected weight change.  HENT: Negative for mouth sores.   Eyes: Negative for visual disturbance.  Respiratory: Negative for cough, chest tightness, shortness of breath and wheezing.   Cardiovascular: Negative for chest pain.  Gastrointestinal: Negative for nausea, vomiting, abdominal pain, diarrhea and constipation.  Endocrine: Negative for polydipsia, polyphagia and polyuria.  Genitourinary: Negative for dysuria, urgency, frequency and hematuria.  Musculoskeletal: Negative for back pain and neck stiffness.  Skin: Negative for rash.  Allergic/Immunologic: Negative for immunocompromised state.  Neurological: Positive for light-headedness and headaches. Negative for syncope.  Hematological: Does not bruise/bleed easily.  Psychiatric/Behavioral: Negative for sleep disturbance. The patient is not nervous/anxious.       Allergies  Review of patient's allergies indicates no known allergies.  Home Medications   Prior to Admission medications   Medication Sig Start Date End Date Taking? Authorizing Provider  aspirin 81 MG EC tablet Take 81 mg by mouth daily.      Historical Provider, MD  baclofen (LIORESAL) 10 MG tablet TAKE 1 TABLET BY MOUTH THREE TIMES DAILY 01/01/13   Ardyth Gal, MD  chlorpheniramine-HYDROcodone (TUSSIONEX) 10-8 MG/5ML LQCR Take 5 mLs by mouth at bedtime as needed. 11/16/11   Macy Mis, MD  Elastic Bandages & Supports (WRIST SPLINT/COCK-UP/LEFT L) MISC 1 Device by Does not apply route once. 01/15/13   Ardyth Gal, MD  hydrochlorothiazide (HYDRODIURIL) 25 MG tablet TAKE 1 TABLET BY MOUTH EVERY MORNING 10/08/12   Ardyth Gal,  MD  ibuprofen (ADVIL,MOTRIN) 600 MG tablet Take 1 tablet (600 mg total) by mouth every 8 (eight) hours as needed for pain. 01/01/13   Tobey GrimJeffrey H Walden, MD  lisinopril (PRINIVIL,ZESTRIL) 20 MG tablet TAKE 1 TABLET BY MOUTH EVERY DAY 04/02/12   Ardyth Galachel Chamberlain, MD  Misc. Devices (CANE) MISC Use as directed       Historical Provider, MD  naproxen (NAPROSYN) 500 MG tablet Take 1 tablet (500 mg total) by mouth 2 (two) times daily with a meal. 01/15/13   Ardyth Galachel Chamberlain, MD  simvastatin (ZOCOR) 20 MG tablet Take 1 tablet (20 mg total) by mouth at bedtime. Brand name. 06/28/11 06/27/12  Josalyn Funches, MD  traMADol (ULTRAM) 50 MG tablet Take 50 mg by mouth 3 (three) times daily. with extra strength Tylenol    Historical Provider, MD  zoster vaccine live, PF, (ZOSTAVAX) [id number] UNT/0.65ML injection Inject 19,400 Units into the skin once. 10/04/11   Ardyth Galachel Chamberlain, MD   BP 173/74 mmHg  Pulse 77  Temp(Src) 98.6 F (37 C) (Oral)  Resp 16  Ht 5\' 9"  (1.753 m)  Wt 226 lb (102.513 kg)  BMI 33.36 kg/m2  SpO2 96% Physical Exam  Constitutional: She is oriented to person, place, and time. She appears well-developed and well-nourished. No distress.  HENT:  Head: Normocephalic and atraumatic.  Mouth/Throat: Oropharynx is clear and moist.  Eyes: Conjunctivae and EOM are normal. Pupils are equal, round, and reactive to light. No scleral icterus.  No horizontal, vertical or rotational nystagmus  Neck: Normal range of motion. Neck supple.  Full active and passive ROM without pain No midline or paraspinal tenderness No nuchal rigidity or meningeal signs  Cardiovascular: Normal rate, regular rhythm, normal heart sounds and intact distal pulses.   No murmur heard. Pulmonary/Chest: Effort normal and breath sounds normal. No respiratory distress. She has no wheezes. She has no rales.  Abdominal: Soft. Bowel sounds are normal. There is no tenderness. There is no rebound and no guarding.  Musculoskeletal: Normal range of motion.  Lymphadenopathy:    She has no cervical adenopathy.  Neurological: She is alert and oriented to person, place, and time. She has normal reflexes. No cranial nerve deficit. She exhibits normal muscle tone. Coordination normal.  Mental Status:  Alert, oriented, thought content appropriate.  Speech fluent without evidence of aphasia. Able to follow 2 step commands without difficulty.  Cranial Nerves:  II:  pupils equal, round, reactive to light III,IV, VI: ptosis not present, extra-ocular motions intact bilaterally  V,VII: smile symmetric, facial light touch sensation equal VIII: hearing grossly normal bilaterally  IX,X: gag reflex present  XI: bilateral shoulder shrug equal and strong XII: midline tongue extension  Motor:  5/5 in upper and lower extremities bilaterally including strong and equal grip strength and dorsiflexion/plantar flexion Sensory: Pinprick and light touch normal in all extremities.  Deep Tendon Reflexes: 2+ and symmetric  Cerebellar: normal finger-to-nose with bilateral upper extremities Gait: normal gait and balance CV: distal pulses palpable throughout   Skin: Skin is warm and dry. No rash noted. She is not diaphoretic.  Psychiatric: She has a normal mood and affect. Her behavior is normal. Judgment and thought content normal.  Nursing note and vitals reviewed.   ED Course  Procedures (including critical care time) Labs Review Labs Reviewed  BASIC METABOLIC PANEL - Abnormal; Notable for the following:    Potassium 3.4 (*)    All other components within normal limits  CBC - Abnormal; Notable for the following:  RBC 3.62 (*)    Hemoglobin 11.3 (*)    HCT 34.4 (*)    All other components within normal limits  I-STAT TROPOININ, ED    Imaging Review Ct Angio Head W/cm &/or Wo Cm  07/08/2015   CLINICAL DATA:  Headache and hypertension.  Abnormal CT head  EXAM: CT ANGIOGRAPHY HEAD  TECHNIQUE: Multidetector CT imaging of the head was performed using the standard protocol during bolus administration of intravenous contrast. Multiplanar CT image reconstructions and MIPs were obtained to evaluate the vascular anatomy.  CONTRAST:  50mL OMNIPAQUE IOHEXOL 350 MG/ML SOLN  COMPARISON:  CT head 07/08/2015  FINDINGS: CT HEAD  Brain: Suprasellar mass measures  approximately 2.7 x 3.0 cm with a thin curvilinear calcification in the wall. Findings are suspicious for giant aneurysm. No subarachnoid hemorrhage noted on the unenhanced head CT performed prior to the CTA.  Calvarium and skull base: Negative  Paranasal sinuses: Negative  Orbits: Negative  CTA HEAD  Anterior circulation: Partially thrombosed giant aneurysm measures 30 x 27 mm on axial images and 30 mm craniocaudal. Aneurysm is partially thrombosed. Approximately 50% of the aneurysm is thrombosed. The aneurysm arises from the left cavernous carotid. There is chronic pressure erosion of the sella which is enlarged. The aneurysm extends into the sella. The aneurysm overlies the optic canal and is compressing the optic nerve and optic chiasm. Right cavernous carotid appears normal. Anterior and middle cerebral arteries are patent bilaterally without significant stenosis.  The upper cervical internal carotid artery is evaluated and is abnormal bilaterally. There appears to be fibromuscular dysplasia with dilatation of the internal carotid artery with a beaded appearance.  Posterior circulation: Both vertebral arteries patent to the basilar. PICA patent bilaterally. Basilar widely patent. Superior cerebellar and posterior cerebral arteries patent bilaterally. No aneurysm in the posterior circulation.  Venous sinuses: Patent  Anatomic variants: None  Delayed phase:Motion degraded images.  Giant aneurysm noted.  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 The aneurysm measures 30 x 27 x 30 mm and is partially thrombosis. No evidence of aneurysm rupture. 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 The aneurysm extends into the sella which is enlarged with chronic bony remodeling.  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  These results were called by telephone at the time of  interpretation on 07/08/2015 at 9:01 pm to [facility] , who verbally acknowledged these results.   Electronically Signed   By: Marlan Palau M.D.   On: 07/08/2015 21:03   Ct Head Wo Contrast  07/08/2015   CLINICAL DATA:  Acute onset left frontal headache and left retro-orbital pain. Hypertension.  EXAM: CT HEAD WITHOUT CONTRAST  TECHNIQUE: Contiguous axial images were obtained from the base of the skull through the vertex without intravenous contrast.  COMPARISON:  None.  FINDINGS: A sellar/ suprasellar mass is seen which shows peripheral calcification, as well as heterogeneous internal high attenuation consistent with blood clot. This measures 2.6 x 3.6 cm, and differential diagnosis includes intracranial aneurysm as well as pituitary macroadenoma, meningioma, and craniopharyngioma with small amount of internal hemorrhage.  There is no significant mass effect or midline shift. No evidence of subarachnoid hemorrhage. No evidence of brain edema or other signs of acute cerebral infarction. Ventricles are stable in size. Chronic sellar enlargement noted, but no other bone lesions identified.  IMPRESSION: 3.6 cm sellar/suprasellar mass with peripheral calcification and small amount of internal  hemorrhage/ blood clot. Differential diagnosis includes intracranial aneurysm, as well as pituitary macro adenoma, meningioma, and craniopharyngioma. Head CTA recommended for further evaluation.  No evidence of subarachnoid hemorrhage or other acute findings.  These results were called by telephone at the time of interpretation on 07/08/2015 at 5:54 pm to Dr. Purvis Sheffield in the ED, who verbally acknowledged these results.   Electronically Signed   By: Myles Rosenthal M.D.   On: 07/08/2015 17:58     EKG Interpretation   Date/Time:  Friday July 08 2015 17:53:11 EDT Ventricular Rate:  66 PR Interval:  205 QRS Duration: 81 QT Interval:  427 QTC Calculation: 447 R Axis:   51 Text Interpretation:  Sinus rhythm  Confirmed by HARRISON  MD, FORREST  ([id number]) on 07/08/2015 6:02:15 PM      MDM   Final diagnoses:  HTN (hypertension)  Headache  Cerebral aneurysm   Barbee Cough presents with headache and reported HTN at home.  BP 154/87 on arrival to the ED.  Pt without neurologic deficit.  CT head and labs pending.    6:11 PM  CT with 3.6cm sellar/suprasellar mass with peripheral calcification and small amount of internal hemorrhage/ blood clot.  Aneurysm vs tumor.  Will obtain CTA and give pain control.    9:10 PM CTA with very large aneurysm. Will consult with neurosurgery.  10:03 PM Pt discussed with Dr. Franky Macho who will evaluate the patient in the ED.    The patient was discussed with and seen by Dr. Romeo Apple who agrees with the treatment plan.  10:47 PM Per the consult note, Dr. Franky Macho recommends d/c home with further evaluation on Monday. He also requests that we add an antihypertensive to her regimen.  After discussion with pharmacy and use of the JNC 8 HTN guidelines, will add amlodipine 5mg  QD.  This has been discussed with patient and she is in agreement with the plan.  She ambulates without difficulty or gait disturbance.    BP 173/74 mmHg  Pulse 77  Temp(Src) 98.6 F (37 C) (Oral)  Resp 16  Ht 5\' 9"  (1.753 m)  Wt 226 lb (102.513 kg)  BMI 33.36 kg/m2  SpO2 96%   Dierdre Forth, PA-C 07/08/15 2249  Osmin Welz, PA-C 07/08/15 2257  Purvis Sheffield, MD 07/09/15 1520

## 2015-07-08 NOTE — Discharge Instructions (Signed)
1. Medications: Amlodipine, usual home medications 2. Treatment: rest, drink plenty of fluids,  3. Follow Up: Please followup with Dr. Franky Machoabbell as directed; return to the ED for worsening symptoms, sudden onset headache, slurred speech or other neurologic complaints

## 2015-07-08 NOTE — Consult Note (Signed)
Reason for Consult:Cerebral aneurysm, giant Referring Physician: Harrison  Judith [name] is an 64 y.o. female.  HPI: who complained of a headache and eye pain this am. She checked her blood pressure and it was elevated. Her blood pressure did respond to her home medication. Nevertheless she sought medical treatment at the [place] today. Head CT showed a large suprasellar mass, subsequent CT angio revealed a large partially thrombosed aneurysm seemingly arising from the left cavernous ICA. CT did not show subarachnoid blood. She says her headache is resolved at this time.   Past Medical History  Diagnosis Date  . Hyperlipidemia   . Hypertension   . Arthritis     History reviewed. No pertinent past surgical history.  Family History  Problem Relation Age of Onset  . Diabetes Daughter   . Hyperlipidemia Daughter   . Hypertension Daughter   . Kidney disease Daughter     Social History:  reports that she has never smoked. She does not have any smokeless tobacco history on file. She reports that she does not drink alcohol or use illicit drugs.  Allergies: No Known Allergies  Medications: I have reviewed the patient's current medications.  Results for orders placed or performed during the hospital encounter of 07/08/15 (from the past 48 hour(s))  Basic metabolic panel     Status: Abnormal   Collection Time: 07/08/15  6:51 PM  Result Value Ref Range   Sodium 137 135 - 145 mmol/L   Potassium 3.4 (L) 3.5 - 5.1 mmol/L   Chloride 101 101 - 111 mmol/L   CO2 28 22 - 32 mmol/L   Glucose, Bld 96 65 - 99 mg/dL   BUN 8 6 - 20 mg/dL   Creatinine, Ser 0.91 0.44 - 1.00 mg/dL   Calcium 9.0 8.9 - 10.3 mg/dL   GFR calc non Af Amer >60 >60 mL/min   GFR calc Af Amer >60 >60 mL/min    Comment: (NOTE) The eGFR has been calculated using the CKD EPI equation. This calculation has not been validated in all clinical situations. eGFR's persistently <60 mL/min signify possible Chronic Kidney Disease.     Anion gap 8 5 - 15  CBC     Status: Abnormal   Collection Time: 07/08/15  6:51 PM  Result Value Ref Range   WBC 6.6 4.0 - 10.5 K/uL   RBC 3.62 (L) 3.87 - 5.11 MIL/uL   Hemoglobin 11.3 (L) 12.0 - 15.0 g/dL   HCT 34.4 (L) 36.0 - 46.0 %   MCV 95.0 78.0 - 100.0 fL   MCH 31.2 26.0 - 34.0 pg   MCHC 32.8 30.0 - 36.0 g/dL   RDW 14.1 11.5 - 15.5 %   Platelets 338 150 - 400 K/uL  I-stat troponin, ED     Status: None   Collection Time: 07/08/15  7:09 PM  Result Value Ref Range   Troponin i, poc 0.00 0.00 - 0.08 ng/mL   Comment 3            Comment: Due to the release kinetics of cTnI, a negative result within the first hours of the onset of symptoms does not rule out myocardial infarction with certainty. If myocardial infarction is still suspected, repeat the test at appropriate intervals.     Ct Angio Head W/cm &/or Wo Cm  07/08/2015   CLINICAL DATA:  Headache and hypertension.  Abnormal CT head  EXAM: CT ANGIOGRAPHY HEAD  TECHNIQUE: Multidetector CT imaging of the head was performed using the   standard protocol during bolus administration of intravenous contrast. Multiplanar CT image reconstructions and MIPs were obtained to evaluate the vascular anatomy.  CONTRAST:  50mL OMNIPAQUE IOHEXOL 350 MG/ML SOLN  COMPARISON:  CT head 07/08/2015  FINDINGS: CT HEAD  Brain: Suprasellar mass measures approximately 2.7 x 3.0 cm with a thin curvilinear calcification in the wall. Findings are suspicious for giant aneurysm. No subarachnoid hemorrhage noted on the unenhanced head CT performed prior to the CTA.  Calvarium and skull base: Negative  Paranasal sinuses: Negative  Orbits: Negative  CTA HEAD  Anterior circulation: Partially thrombosed giant aneurysm measures 30 x 27 mm on axial images and 30 mm craniocaudal. Aneurysm is partially thrombosed. Approximately 50% of the aneurysm is thrombosed. The aneurysm arises from the left cavernous carotid. There is chronic pressure erosion of the sella which is  enlarged. The aneurysm extends into the sella. The aneurysm overlies the optic canal and is compressing the optic nerve and optic chiasm. Right cavernous carotid appears normal. Anterior and middle cerebral arteries are patent bilaterally without significant stenosis.  The upper cervical internal carotid artery is evaluated and is abnormal bilaterally. There appears to be fibromuscular dysplasia with dilatation of the internal carotid artery with a beaded appearance.  Posterior circulation: Both vertebral arteries patent to the basilar. PICA patent bilaterally. Basilar widely patent. Superior cerebellar and posterior cerebral arteries patent bilaterally. No aneurysm in the posterior circulation.  Venous sinuses: Patent  Anatomic variants: None  Delayed phase:Motion degraded images.  Giant aneurysm noted.  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 The aneurysm measures 30 x 27 x 30 mm and is partially thrombosis. No evidence of aneurysm rupture. 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 The aneurysm extends into the sella which is enlarged with chronic bony remodeling.  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  These results were called by telephone at the time of interpretation on 07/08/2015 at 9:01 pm to HANNAH MUTHERSBAUGH , who verbally acknowledged these results.   Electronically Signed   By: Charles  Clark M.D.   On: 07/08/2015 21:03   Ct Head Wo Contrast  07/08/2015   CLINICAL DATA:  Acute onset left frontal headache and left retro-orbital pain. Hypertension.  EXAM: CT HEAD WITHOUT CONTRAST  TECHNIQUE: Contiguous axial images were obtained from the base of the skull through the vertex without intravenous contrast.  COMPARISON:  None.  FINDINGS: A sellar/ suprasellar mass is seen which shows peripheral calcification, as well as heterogeneous internal high  attenuation consistent with blood clot. This measures 2.6 x 3.6 cm, and differential diagnosis includes intracranial aneurysm as well as pituitary macroadenoma, meningioma, and craniopharyngioma with small amount of internal hemorrhage.  There is no significant mass effect or midline shift. No evidence of subarachnoid hemorrhage. No evidence of brain edema or other signs of acute cerebral infarction. Ventricles are stable in size. Chronic sellar enlargement noted, but no other bone lesions identified.  IMPRESSION: 3.6 cm sellar/suprasellar mass with peripheral calcification and small amount of internal hemorrhage/ blood clot. Differential diagnosis includes intracranial aneurysm, as well as pituitary macro adenoma, meningioma, and craniopharyngioma. Head CTA recommended for further evaluation.  No evidence of subarachnoid hemorrhage or other acute findings.  These results were called by telephone at the time of interpretation on 07/08/2015 at 5:54 pm to Dr. Forrest Harrison in the ED, who verbally acknowledged these results.   Electronically Signed   By: John    Stahl M.D.   On: 07/08/2015 17:58    Review of Systems  Constitutional: Negative.   Eyes: Positive for pain.  Respiratory: Negative.   Cardiovascular: Negative.   Gastrointestinal: Negative.   Genitourinary: Negative.   Musculoskeletal: Negative.   Skin: Negative.   Neurological: Positive for headaches.       Light headed this morning  Endo/Heme/Allergies: Negative.   Psychiatric/Behavioral: Negative.    Blood pressure 158/86, pulse 74, temperature 98.6 F (37 C), temperature source Oral, resp. rate 18, height 5' 9" (1.753 m), weight 102.513 kg (226 lb), SpO2 96 %. Physical Exam  Constitutional: She is oriented to person, place, and time. She appears well-developed and well-nourished. No distress.  HENT:  Head: Normocephalic and atraumatic.  Right Ear: External ear normal.  Left Ear: External ear normal.  Nose: Nose normal.   Mouth/Throat: Oropharynx is clear and moist. No oropharyngeal exudate.  Eyes: Conjunctivae and EOM are normal. Pupils are equal, round, and reactive to light.  Neck: Normal range of motion. Neck supple.  Cardiovascular: Normal rate, regular rhythm and normal heart sounds.   Respiratory: Effort normal.  GI: Soft. Bowel sounds are normal.  Musculoskeletal: Normal range of motion. She exhibits no edema or tenderness.  Neurological: She is alert and oriented to person, place, and time. No cranial nerve deficit or sensory deficit. She exhibits normal muscle tone. She displays a negative Romberg sign. Coordination normal. She displays no Babinski's sign on the right side. She displays no Babinski's sign on the left side.  Proprioception is intact Normal muscle tone and bulk Coordination is normal Gait characterized by a marked stoop  Skin: Skin is warm and dry.  Psychiatric: She has a normal mood and affect. Her behavior is normal. Judgment and thought content normal.    Assessment/Plan: She can be discharged to home. I will contact on Monday to complete evaluation of the aneurysm, which will include an MRI, and a 4 vessel cerebral angiogram. I have spoken with Ms. Markwood, her daughters. They understand and will await my call.   CABBELL,KYLE L 07/08/2015, 10:27 PM      

## 2015-07-18 ENCOUNTER — Other Ambulatory Visit (HOSPITAL_COMMUNITY): Payer: Self-pay | Admitting: Neurosurgery

## 2015-07-18 DIAGNOSIS — I671 Cerebral aneurysm, nonruptured: Secondary | ICD-10-CM

## 2015-07-27 ENCOUNTER — Ambulatory Visit (HOSPITAL_COMMUNITY): Payer: Medicaid Other

## 2015-08-01 DEATH — deceased

## 2017-05-24 IMAGING — CT CT ANGIO HEAD
1 of 11 series · 1 of 33 positions shown · IV contrast (Iohexol (Omnipaque 350))
Comparison: CT head 07/08/2015

CLINICAL DATA: Headache and hypertension.  Abnormal CT head

EXAM:
CT ANGIOGRAPHY HEAD
TECHNIQUE: Multidetector CT imaging of the head was performed using the
standard protocol during bolus administration of intravenous
contrast. Multiplanar CT image reconstructions and MIPs were
obtained to evaluate the vascular anatomy.
CONTRAST:  50mL OMNIPAQUE IOHEXOL 350 MG/ML SOLN

[Series 200: locator · axial · 0.49mm/px · 1 of 1 slices shown]
[im 1/1  soft-tissue]
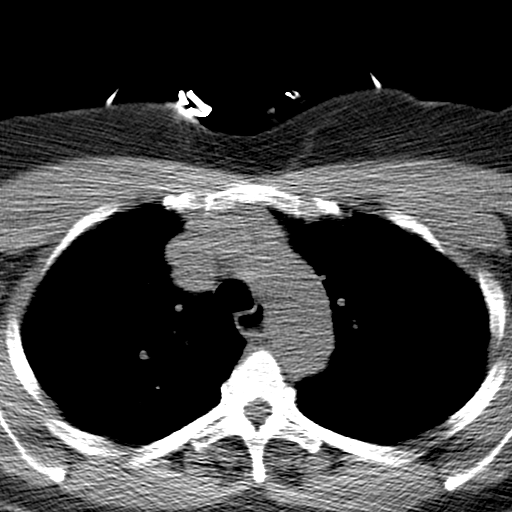

[1 of 33 positions shown; findings below may reference images not displayed]

FINDINGS: CT HEAD

Brain: Suprasellar mass measures approximately 2.7 x 3.0 cm with a
thin curvilinear calcification in the wall. Findings are suspicious
for giant aneurysm. No subarachnoid hemorrhage noted on the
unenhanced head CT performed prior to the CTA.

Calvarium and skull base: Negative

Paranasal sinuses: Negative

Orbits: Negative

CTA HEAD

Anterior circulation: Partially thrombosed giant aneurysm measures
30 x 27 mm on axial images and 30 mm craniocaudal. Aneurysm is
partially thrombosed. Approximately 50% of the aneurysm is
thrombosed. The aneurysm arises from the left cavernous carotid.
There is chronic pressure erosion of the sella which is enlarged.
The aneurysm extends into the sella. The aneurysm overlies the optic
canal and is compressing the optic nerve and optic chiasm. Right
cavernous carotid appears normal. Anterior and middle cerebral
arteries are patent bilaterally without significant stenosis.

The upper cervical internal carotid artery is evaluated and is
abnormal bilaterally. There appears to be fibromuscular dysplasia
with dilatation of the internal carotid artery with a beaded
appearance.

Posterior circulation: Both vertebral arteries patent to the
basilar. PICA patent bilaterally. Basilar widely patent. Superior
cerebellar and posterior cerebral arteries patent bilaterally. No
aneurysm in the posterior circulation.

Venous sinuses: Patent

Anatomic variants: None

Delayed phase:Motion degraded images.  Giant aneurysm noted.
IMPRESSION: Giant aneurysm involving the sella and suprasellar cistern, arising
from the left cavernous carotid. The aneurysm measures 30 x 27 x 30
mm and is partially thrombosis. No evidence of aneurysm rupture.
There is compression of left optic nerve and optic chiasm. The
aneurysm extends into the sella which is enlarged with chronic bony
remodeling.

Fibromuscular dysplasia of the internal carotid artery bilaterally,
incompletely evaluated on the study.

These results were called by telephone at the time of interpretation
on 07/08/2015 at [DATE] to NEJMEDINE RAWAFI , who verbally
acknowledged these results.
